# Patient Record
Sex: Male | Born: 1965 | Race: White | Hispanic: No | Marital: Married | Smoking: Former smoker
Health system: Southern US, Community
[De-identification: ages and names within clinical notes are randomized; demographics above are authoritative.]

## PROBLEM LIST (undated history)

## (undated) DIAGNOSIS — M51379 Other intervertebral disc degeneration, lumbosacral region without mention of lumbar back pain or lower extremity pain: Secondary | ICD-10-CM

## (undated) DIAGNOSIS — M533 Sacrococcygeal disorders, not elsewhere classified: Secondary | ICD-10-CM

## (undated) DIAGNOSIS — Z87442 Personal history of urinary calculi: Secondary | ICD-10-CM

## (undated) DIAGNOSIS — K219 Gastro-esophageal reflux disease without esophagitis: Secondary | ICD-10-CM

## (undated) DIAGNOSIS — M5137 Other intervertebral disc degeneration, lumbosacral region: Secondary | ICD-10-CM

## (undated) DIAGNOSIS — C801 Malignant (primary) neoplasm, unspecified: Secondary | ICD-10-CM

## (undated) DIAGNOSIS — F319 Bipolar disorder, unspecified: Secondary | ICD-10-CM

## (undated) DIAGNOSIS — F419 Anxiety disorder, unspecified: Secondary | ICD-10-CM

## (undated) DIAGNOSIS — R51 Headache: Secondary | ICD-10-CM

## (undated) DIAGNOSIS — R519 Headache, unspecified: Secondary | ICD-10-CM

## (undated) DIAGNOSIS — F329 Major depressive disorder, single episode, unspecified: Secondary | ICD-10-CM

## (undated) DIAGNOSIS — F32A Depression, unspecified: Secondary | ICD-10-CM

## (undated) DIAGNOSIS — E119 Type 2 diabetes mellitus without complications: Secondary | ICD-10-CM

## (undated) HISTORY — DX: Other intervertebral disc degeneration, lumbosacral region: M51.37

## (undated) HISTORY — DX: Other intervertebral disc degeneration, lumbosacral region without mention of lumbar back pain or lower extremity pain: M51.379

## (undated) HISTORY — DX: Sacrococcygeal disorders, not elsewhere classified: M53.3

---

## 2004-05-22 HISTORY — PX: BACK SURGERY: SHX140

## 2017-01-09 ENCOUNTER — Ambulatory Visit: Payer: Self-pay | Admitting: Physician Assistant

## 2017-01-10 ENCOUNTER — Telehealth: Payer: Self-pay | Admitting: Surgery

## 2017-01-10 NOTE — Telephone Encounter (Signed)
-----   Message from Denman George, RN sent at 01/08/2017  9:28 AM EDT ----- Regarding: Needs Consult with [facility] Contact: [phone] Please schedule a new pt. Consult with Dr. Trula Slade, prior to ALIF on 02/07/17.  Please remind the pt. To bring copy of LS spine films to appt.  Thank you.

## 2017-01-10 NOTE — Telephone Encounter (Signed)
Sched appt 01/29/17 at 1:30. Lm on hm#.

## 2017-01-17 ENCOUNTER — Encounter: Payer: Self-pay | Admitting: Surgery

## 2017-01-18 ENCOUNTER — Encounter: Payer: Self-pay | Admitting: Surgery

## 2017-01-23 ENCOUNTER — Other Ambulatory Visit: Payer: Self-pay

## 2017-01-26 ENCOUNTER — Encounter: Payer: Self-pay | Admitting: Surgery

## 2017-01-29 ENCOUNTER — Encounter: Payer: Self-pay | Admitting: Surgery

## 2017-01-29 ENCOUNTER — Ambulatory Visit (INDEPENDENT_AMBULATORY_CARE_PROVIDER_SITE_OTHER): Payer: BLUE CROSS/BLUE SHIELD | Admitting: Surgery

## 2017-01-29 VITALS — BP 129/91 | HR 84 | Temp 97.2°F | Resp 16 | Ht 69.0 in | Wt 164.0 lb

## 2017-01-29 DIAGNOSIS — M479 Spondylosis, unspecified: Secondary | ICD-10-CM | POA: Diagnosis not present

## 2017-01-29 NOTE — Progress Notes (Signed)
Vascular and Vein Specialist of [facility]  Patient name: Maxi Rodas MRN: [id number] DOB: 11-Jul-1965 Sex: male   REQUESTING PROVIDER:    Dr. Rolena Infante   REASON FOR CONSULT:    ALIF L5-S1  HISTORY OF PRESENT ILLNESS:   Erskine Steinfeldt is a 51 y.o. male, who is Referred today by Dr. Rolena Infante for evaluation of ALIF for L5-S1.  He has failed non-operative therapy, and anterior exposure has been recommended.  The patient has never had abdominal surgery.  He has a history of smoking but quit 18 years ago.  PAST MEDICAL HISTORY    Past Medical History:  Diagnosis Date  . DDD (degenerative disc disease), lumbosacral   . Sacroiliac joint pain      FAMILY HISTORY   No family history on file.  SOCIAL HISTORY:   Social History   Social History  . Marital status: Married    Spouse name: N/A  . Number of children: N/A  . Years of education: N/A   Occupational History  . Not on file.   Social History Main Topics  . Smoking status: Former Smoker    Types: Cigarettes    Quit date: 01/29/2001  . Smokeless tobacco: Never Used  . Alcohol use 1.2 oz/week    2 Cans of beer per week  . Drug use: Unknown  . Sexual activity: Not on file   Other Topics Concern  . Not on file   Social History Narrative  . No narrative on file    ALLERGIES:    No Known Allergies  CURRENT MEDICATIONS:    Current Outpatient Prescriptions  Medication Sig Dispense Refill  . atorvastatin (LIPITOR) 10 MG tablet Take 10 mg by mouth daily.    . carbamazepine (EQUETRO) 200 MG CP12 12 hr capsule Take 200 mg by mouth.    . lamoTRIgine (LAMICTAL) 200 MG tablet Take 200 mg by mouth daily.    Marland Kitchen lisdexamfetamine (VYVANSE) 40 MG capsule Take 40 mg by mouth every morning.    Marland Kitchen LORazepam (ATIVAN) 0.5 MG tablet Take 0.5 mg by mouth every 8 (eight) hours.    . metFORMIN (GLUCOPHAGE) 1000 MG tablet Take 1,000 mg by mouth 2 (two) times daily with a meal.    . SUMATRIPTAN  SUCCINATE PO Take by mouth.    . topiramate (TOPAMAX) 100 MG tablet Take 100 mg by mouth 2 (two) times daily.     No current facility-administered medications for this visit.     REVIEW OF SYSTEMS:   [X]  denotes positive finding, [ ]  denotes negative finding Cardiac  Comments:  Chest pain or chest pressure:    Shortness of breath upon exertion:    Short of breath when lying flat:    Irregular heart rhythm:        Vascular    Pain in calf, thigh, or hip brought on by ambulation:    Pain in feet at night that wakes you up from your sleep:     Blood clot in your veins:    Leg swelling:         Pulmonary    Oxygen at home:    Productive cough:     Wheezing:         Neurologic    Sudden weakness in arms or legs:     Sudden numbness in arms or legs:     Sudden onset of difficulty speaking or slurred speech:    Temporary loss of vision in one eye:     Problems  with dizziness:         Gastrointestinal    Blood in stool:      Vomited blood:         Genitourinary    Burning when urinating:     Blood in urine:        Psychiatric    Major depression:         Hematologic    Bleeding problems:    Problems with blood clotting too easily:        Skin    Rashes or ulcers:        Constitutional    Fever or chills:     PHYSICAL EXAM:   Vitals:   01/29/17 1352  BP: (!) 129/91  Pulse: 84  Resp: 16  Temp: (!) 97.2 F (36.2 C)  SpO2: 97%  Weight: 164 lb (74.4 kg)  Height: 5\' 9"  (1.753 m)    GENERAL: The patient is a well-nourished male, in no acute distress. The vital signs are documented above. CARDIAC: There is a regular rate and rhythm.  VASCULAR: Palpable pedal pulses PULMONARY: Nonlabored respirations ABDOMEN: Soft and non-tender MUSCULOSKELETAL: There are no major deformities or cyanosis. NEUROLOGIC: No focal weakness or paresthesias are detected. SKIN: There are no ulcers or rashes noted. PSYCHIATRIC: The patient has a normal affect.  STUDIES:   I have  reviewed his MRI which shows normal anatomy in the vena cava and aorta with respect to their bifurcations.  ASSESSMENT and PLAN   Degenerative disc disease: The patient has been scheduled for anterior exposure of L5-S1.  I have discussed the risks and benefits of the procedure including the risk of injury to the iliac artery and vein, ureter, and retrograde ejaculation and addition of wound complications.  All the patient's questions were answered.  He is scheduled for surgery in the immediate future.   Annamarie Major, MD Vascular and Vein Specialists of [facility] [phone] Pager [phone]

## 2017-01-31 NOTE — Pre-Procedure Instructions (Signed)
Ian Perez  01/31/2017      [facility] - [place] Buff, [place] [address] [place] [address] Phone: [phone] Fax: [place], [place] [place] [address] Phone: [phone] Fax: [phone]    Your procedure is scheduled on  Sept 19  Report to [facility] at 630 A.M.  Call this number if you have problems the morning of surgery:  [phone]   Remember:  Do not eat food or drink liquids after midnight.  Take these medicines the morning of surgery with A SIP OF WATER Lamotrigine (Lamictal), Lisdexamfetamine (Vyvanse), Lorazepam (Ativan) if needed, Sumatriptan (Imitrex) if needed  Stop taking aspirin, BC's, Goody's, Herbal medications, Fish Oil, Ibuprofen, Advil, Motrin, Aleve     How to Manage Your Diabetes Before and After Surgery  Why is it important to control my blood sugar before and after surgery? . Improving blood sugar levels before and after surgery helps healing and can limit problems. . A way of improving blood sugar control is eating a healthy diet by: o  Eating less sugar and carbohydrates o  Increasing activity/exercise o  Talking with your doctor about reaching your blood sugar goals . High blood sugars (greater than 180 mg/dL) can raise your risk of infections and slow your recovery, so you will need to focus on controlling your diabetes during the weeks before surgery. . Make sure that the doctor who takes care of your diabetes knows about your planned surgery including the date and location.  How do I manage my blood sugar before surgery? . Check your blood sugar at least 4 times a day, starting 2 days before surgery, to make sure that the level is not too high or low. o Check your blood sugar the morning of your surgery when you wake up and every 2 hours until you get to  the Short Stay unit. . If your blood sugar is less than 70 mg/dL, you will need to treat for low blood sugar: o Do not take insulin. o Treat a low blood sugar (less than 70 mg/dL) with  cup of clear juice (cranberry or apple), 4 glucose tablets, OR glucose gel. o Recheck blood sugar in 15 minutes after treatment (to make sure it is greater than 70 mg/dL). If your blood sugar is not greater than 70 mg/dL on recheck, call [phone] for further instructions. . Report your blood sugar to the short stay nurse when you get to Short Stay.  . If you are admitted to the hospital after surgery: o Your blood sugar will be checked by the staff and you will probably be given insulin after surgery (instead of oral diabetes medicines) to make sure you have good blood sugar levels. o The goal for blood sugar control after surgery is 80-180 mg/dL.              WHAT DO I DO ABOUT MY DIABETES MEDICATION?   Marland Kitchen Do not take oral diabetes medicines (pills) the morning of surgery. Metformin (Glucophage)       . The day of surgery, do not take other diabetes injectables, including Byetta (exenatide), Bydureon (exenatide ER), Victoza (liraglutide), or Trulicity (dulaglutide).  . If your CBG is greater than 220 mg/dL, you may take  of your sliding scale (correction) dose of insulin.  Other Instructions:  Patient Signature:  Date:   Nurse Signature:  Date:   Reviewed and Endorsed by [facility] Patient Education Committee, August 2015  Do not wear jewelry, make-up or nail polish.  Do not wear lotions, powders, or perfumes, or deoderant.  Do not shave 48 hours prior to surgery.  Men may shave face and neck.  Do not bring valuables to the hospital.  [facility] is not responsible for any belongings or valuables.  Contacts, dentures or bridgework may not be worn into surgery.  Leave your suitcase in the car.  After surgery it may be brought to your room.  For patients admitted to the  hospital, discharge time will be determined by your treatment team.  Patients discharged the day of surgery will not be allowed to drive home.    Special instructions: [facility] - Preparing for Surgery  Before surgery, you can play an important role.  Because skin is not sterile, your skin needs to be as free of germs as possible.  You can reduce the number of germs on you skin by washing with CHG (chlorahexidine gluconate) soap before surgery.  CHG is an antiseptic cleaner which kills germs and bonds with the skin to continue killing germs even after washing.  Please DO NOT use if you have an allergy to CHG or antibacterial soaps.  If your skin becomes reddened/irritated stop using the CHG and inform your nurse when you arrive at Short Stay.  Do not shave (including legs and underarms) for at least 48 hours prior to the first CHG shower.  You may shave your face.  Please follow these instructions carefully:   1.  Shower with CHG Soap the night before surgery and the  morning of Surgery.  2.  If you choose to wash your hair, wash your hair first as usual with your  normal shampoo.  3.  After you shampoo, rinse your hair and body thoroughly to remove the Shampoo.  4.  Use CHG as you would any other liquid soap.  You can apply chg directly to the skin and wash gently with scrungie or a clean washcloth.  5.  Apply the CHG Soap to your body ONLY FROM THE NECK DOWN.  Do not use on open wounds or open sores.  Avoid contact with your eyes,  ears, mouth and genitals (private parts).  Wash genitals (private parts)  with your normal soap.  6.  Wash thoroughly, paying special attention to the area where your surgery  will be performed.  7.  Thoroughly rinse your body with warm water from the neck down.  8.  DO NOT shower/wash with your normal soap after using and rinsing off  the CHG Soap.  9.  Pat yourself dry with a clean towel.            10.  Wear clean pajamas.            11.  Place clean sheets  on your bed the night of your first shower and do not sleep with pets.  Day of Surgery  Do not apply any lotions/deoderants the morning of surgery.  Please wear clean clothes to the hospital/surgery center.     Please read over the following fact sheets that you were given. Pain Booklet, Coughing and Deep Breathing, MRSA Information and Surgical Site Infection Prevention

## 2017-02-01 ENCOUNTER — Encounter (HOSPITAL_COMMUNITY): Payer: Self-pay

## 2017-02-01 ENCOUNTER — Encounter (HOSPITAL_COMMUNITY)
Admission: RE | Admit: 2017-02-01 | Discharge: 2017-02-01 | Disposition: A | Discharge status: Home / Self Care | Payer: BLUE CROSS/BLUE SHIELD | Source: Ambulatory Visit | Attending: Orthopedic Surgery | Admitting: Orthopedic Surgery

## 2017-02-01 DIAGNOSIS — Z01818 Encounter for other preprocedural examination: Secondary | ICD-10-CM | POA: Insufficient documentation

## 2017-02-01 DIAGNOSIS — M5137 Other intervertebral disc degeneration, lumbosacral region: Secondary | ICD-10-CM | POA: Diagnosis not present

## 2017-02-01 HISTORY — DX: Headache, unspecified: R51.9

## 2017-02-01 HISTORY — DX: Gastro-esophageal reflux disease without esophagitis: K21.9

## 2017-02-01 HISTORY — DX: Headache: R51

## 2017-02-01 HISTORY — DX: Type 2 diabetes mellitus without complications: E11.9

## 2017-02-01 HISTORY — DX: Personal history of urinary calculi: Z87.442

## 2017-02-01 HISTORY — DX: Malignant (primary) neoplasm, unspecified: C80.1

## 2017-02-01 HISTORY — DX: Major depressive disorder, single episode, unspecified: F32.9

## 2017-02-01 HISTORY — DX: Depression, unspecified: F32.A

## 2017-02-01 HISTORY — DX: Anxiety disorder, unspecified: F41.9

## 2017-02-01 HISTORY — DX: Bipolar disorder, unspecified: F31.9

## 2017-02-01 LAB — BASIC METABOLIC PANEL WITH GFR
Anion gap: 8 (ref 5–15)
BUN: 13 mg/dL (ref 6–20)
CO2: 24 mmol/L (ref 22–32)
Calcium: 9.6 mg/dL (ref 8.9–10.3)
Chloride: 104 mmol/L (ref 101–111)
Creatinine, Ser: 1.29 mg/dL — ABNORMAL HIGH (ref 0.61–1.24)
GFR calc Af Amer: >60 mL/min
GFR calc non Af Amer: >60 mL/min
Glucose, Bld: 157 mg/dL — ABNORMAL HIGH (ref 65–99)
Potassium: 4.3 mmol/L (ref 3.5–5.1)
Sodium: 136 mmol/L (ref 135–145)

## 2017-02-01 LAB — TYPE AND SCREEN
ABO/RH(D): O POS
ANTIBODY SCREEN: NEGATIVE

## 2017-02-01 LAB — CBC
HCT: 44.5 % (ref 39.0–52.0)
Hemoglobin: 14.8 g/dL (ref 13.0–17.0)
MCH: 29.2 pg (ref 26.0–34.0)
MCHC: 33.3 g/dL (ref 30.0–36.0)
MCV: 87.8 fL (ref 78.0–100.0)
PLATELETS: 227 10*3/uL (ref 150–400)
RBC: 5.07 MIL/uL (ref 4.22–5.81)
RDW: 13.5 % (ref 11.5–15.5)
WBC: 6.5 10*3/uL (ref 4.0–10.5)

## 2017-02-01 LAB — GLUCOSE, CAPILLARY: Glucose-Capillary: 149 mg/dL — ABNORMAL HIGH (ref 65–99)

## 2017-02-01 LAB — SURGICAL PCR SCREEN
MRSA, PCR: NEGATIVE
Staphylococcus aureus: NEGATIVE

## 2017-02-01 LAB — ABO/RH: ABO/RH(D): O POS

## 2017-02-01 NOTE — Progress Notes (Signed)
PCP- Dr. Vista Lawman with [facility]. Chart will be referred to anesth. For review, per Dr. Rolena Infante request. Call to [facility] & requested last Hgb A1c done on June 29th. Nichelle  at [facility] agrees to fax to [facility].

## 2017-02-02 NOTE — Progress Notes (Addendum)
Anesthesia Chart Review:  Patient is a 51 year old male scheduled for L5-S1 anterior lumbar fusion, abdominal exposure on 02/07/2017 with Melina Schools, M.D. and Harold Barban, M.D.  - PCP is Charlcie Cradle, MD (notes in care everywhere)   PMH includes: DM, bipolar disorder, GERD. Former smoker. BMI 24. Neck  Medications include: Lipitor, carbamazepine, Lamictal, Vyvanse, metformin, Topamax  Preoperative labs reviewed. - Glucose 157. HbA1c was 6.7 on 11/12/16 (care everywhere)  Stress echo 12/21/12 (care everywhere): 1. Normal stress test. Normal resting study with no wall motion abnormalities at rest and at peak stress.  2. Grade I diastolic dysfunction. 3. Trivial TR, PR, MR. 4. Mild LVH. 5. Maximum workload of 10.40 METs was achieved during exercise  If no changes, I anticipate pt can proceed with surgery as scheduled.   Willeen Cass, FNP-BC [facility]/Anesthesiology Phone: [phone] 02/02/2017 11:37 AM

## 2017-02-07 ENCOUNTER — Encounter (HOSPITAL_COMMUNITY)
Admission: RE | Disposition: A | Discharge status: Home / Self Care | Payer: Self-pay | Source: Ambulatory Visit | Attending: Orthopedic Surgery

## 2017-02-07 ENCOUNTER — Inpatient Hospital Stay (HOSPITAL_COMMUNITY): Payer: BLUE CROSS/BLUE SHIELD | Admitting: Anesthesiology

## 2017-02-07 ENCOUNTER — Inpatient Hospital Stay (HOSPITAL_COMMUNITY): Payer: BLUE CROSS/BLUE SHIELD

## 2017-02-07 ENCOUNTER — Encounter (HOSPITAL_COMMUNITY): Payer: Self-pay | Admitting: *Deleted

## 2017-02-07 ENCOUNTER — Inpatient Hospital Stay (HOSPITAL_COMMUNITY): Payer: BLUE CROSS/BLUE SHIELD | Admitting: Emergency Medicine

## 2017-02-07 ENCOUNTER — Observation Stay (HOSPITAL_COMMUNITY)
Admission: RE | Admit: 2017-02-07 | Discharge: 2017-02-08 | Disposition: A | Discharge status: Home / Self Care | Payer: BLUE CROSS/BLUE SHIELD | Source: Ambulatory Visit | Attending: Orthopedic Surgery | Admitting: Orthopedic Surgery

## 2017-02-07 DIAGNOSIS — Z419 Encounter for procedure for purposes other than remedying health state, unspecified: Secondary | ICD-10-CM

## 2017-02-07 DIAGNOSIS — G43909 Migraine, unspecified, not intractable, without status migrainosus: Secondary | ICD-10-CM | POA: Insufficient documentation

## 2017-02-07 DIAGNOSIS — M2578 Osteophyte, vertebrae: Secondary | ICD-10-CM | POA: Insufficient documentation

## 2017-02-07 DIAGNOSIS — M533 Sacrococcygeal disorders, not elsewhere classified: Secondary | ICD-10-CM | POA: Diagnosis not present

## 2017-02-07 DIAGNOSIS — M1990 Unspecified osteoarthritis, unspecified site: Secondary | ICD-10-CM | POA: Diagnosis not present

## 2017-02-07 DIAGNOSIS — Z79899 Other long term (current) drug therapy: Secondary | ICD-10-CM | POA: Insufficient documentation

## 2017-02-07 DIAGNOSIS — Z7984 Long term (current) use of oral hypoglycemic drugs: Secondary | ICD-10-CM | POA: Insufficient documentation

## 2017-02-07 DIAGNOSIS — M4320 Fusion of spine, site unspecified: Secondary | ICD-10-CM

## 2017-02-07 DIAGNOSIS — F419 Anxiety disorder, unspecified: Secondary | ICD-10-CM | POA: Diagnosis not present

## 2017-02-07 DIAGNOSIS — Z87891 Personal history of nicotine dependence: Secondary | ICD-10-CM | POA: Insufficient documentation

## 2017-02-07 DIAGNOSIS — M5137 Other intervertebral disc degeneration, lumbosacral region: Principal | ICD-10-CM | POA: Insufficient documentation

## 2017-02-07 DIAGNOSIS — E119 Type 2 diabetes mellitus without complications: Secondary | ICD-10-CM | POA: Diagnosis not present

## 2017-02-07 DIAGNOSIS — Z981 Arthrodesis status: Secondary | ICD-10-CM

## 2017-02-07 DIAGNOSIS — K219 Gastro-esophageal reflux disease without esophagitis: Secondary | ICD-10-CM | POA: Diagnosis not present

## 2017-02-07 DIAGNOSIS — E7800 Pure hypercholesterolemia, unspecified: Secondary | ICD-10-CM | POA: Diagnosis not present

## 2017-02-07 DIAGNOSIS — F319 Bipolar disorder, unspecified: Secondary | ICD-10-CM | POA: Diagnosis not present

## 2017-02-07 DIAGNOSIS — M5136 Other intervertebral disc degeneration, lumbar region: Secondary | ICD-10-CM | POA: Diagnosis present

## 2017-02-07 HISTORY — PX: ANTERIOR LUMBAR FUSION: SHX1170

## 2017-02-07 HISTORY — PX: ABDOMINAL EXPOSURE: SHX5708

## 2017-02-07 LAB — GLUCOSE, CAPILLARY
GLUCOSE-CAPILLARY: 150 mg/dL — AB (ref 65–99)
Glucose-Capillary: 125 mg/dL — ABNORMAL HIGH (ref 65–99)
Glucose-Capillary: 198 mg/dL — ABNORMAL HIGH (ref 65–99)
Glucose-Capillary: 185 mg/dL — ABNORMAL HIGH (ref 65–99)

## 2017-02-07 SURGERY — ANTERIOR LUMBAR FUSION 1 LEVEL
Anesthesia: General | Site: Spine Lumbar

## 2017-02-07 MED ORDER — FENTANYL CITRATE (PF) 250 MCG/5ML IJ SOLN
INTRAMUSCULAR | Status: AC
  Filled 2017-02-07: qty 5

## 2017-02-07 MED ORDER — SUGAMMADEX SODIUM 200 MG/2ML IV SOLN
INTRAVENOUS | Status: AC
  Filled 2017-02-07: qty 2

## 2017-02-07 MED ORDER — METFORMIN HCL 500 MG PO TABS
1000.0000 mg | ORAL_TABLET | Freq: Two times a day (BID) | ORAL | Status: DC
  Administered 2017-02-07 – 2017-02-08 (×2): 1000 mg via ORAL
  Filled 2017-02-07 (×2): qty 2

## 2017-02-07 MED ORDER — MENTHOL 3 MG MT LOZG: 1.0000 | LOZENGE | OROMUCOSAL | Status: DC | PRN

## 2017-02-07 MED ORDER — LACTATED RINGERS IV SOLN
INTRAVENOUS | Status: DC
  Administered 2017-02-07: 17:00:00 via INTRAVENOUS

## 2017-02-07 MED ORDER — ONDANSETRON HCL 4 MG/2ML IJ SOLN
INTRAMUSCULAR | Status: AC
Start: 2017-02-07 — End: ?
  Filled 2017-02-07: qty 2

## 2017-02-07 MED ORDER — FAMOTIDINE 20 MG PO TABS: 20.0000 mg | ORAL_TABLET | Freq: Two times a day (BID) | ORAL | Status: DC | PRN

## 2017-02-07 MED ORDER — MEPERIDINE HCL 25 MG/ML IJ SOLN: 6.2500 mg | INTRAMUSCULAR | Status: DC | PRN

## 2017-02-07 MED ORDER — SODIUM CHLORIDE 0.9 % IV SOLN: 250.0000 mL | INTRAVENOUS | Status: DC

## 2017-02-07 MED ORDER — LISDEXAMFETAMINE DIMESYLATE 20 MG PO CAPS
40.0000 mg | ORAL_CAPSULE | Freq: Every day | ORAL | Status: DC
  Administered 2017-02-08: 40 mg via ORAL
  Filled 2017-02-07: qty 2

## 2017-02-07 MED ORDER — PHENYLEPHRINE HCL 10 MG/ML IJ SOLN
INTRAVENOUS | Status: DC | PRN
  Administered 2017-02-07: 20 ug/min via INTRAVENOUS

## 2017-02-07 MED ORDER — CEFAZOLIN SODIUM-DEXTROSE 2-4 GM/100ML-% IV SOLN
2.0000 g | Freq: Three times a day (TID) | INTRAVENOUS | Status: AC
  Administered 2017-02-07 – 2017-02-08 (×2): 2 g via INTRAVENOUS
  Filled 2017-02-07 (×2): qty 100

## 2017-02-07 MED ORDER — LIDOCAINE 2% (20 MG/ML) 5 ML SYRINGE
INTRAMUSCULAR | Status: AC
  Filled 2017-02-07: qty 5

## 2017-02-07 MED ORDER — ACETAMINOPHEN 325 MG PO TABS: 650.0000 mg | ORAL_TABLET | ORAL | Status: DC | PRN

## 2017-02-07 MED ORDER — DOCUSATE SODIUM 100 MG PO CAPS
100.0000 mg | ORAL_CAPSULE | Freq: Two times a day (BID) | ORAL | Status: DC
  Administered 2017-02-07 – 2017-02-08 (×2): 100 mg via ORAL
  Filled 2017-02-07 (×2): qty 1

## 2017-02-07 MED ORDER — LIDOCAINE HCL (CARDIAC) 20 MG/ML IV SOLN
INTRAVENOUS | Status: DC | PRN
  Administered 2017-02-07: 80 mg via INTRAVENOUS

## 2017-02-07 MED ORDER — TOPIRAMATE 100 MG PO TABS
100.0000 mg | ORAL_TABLET | Freq: Every day | ORAL | Status: DC
  Administered 2017-02-07: 100 mg via ORAL
  Filled 2017-02-07: qty 1

## 2017-02-07 MED ORDER — ENOXAPARIN SODIUM 40 MG/0.4ML ~~LOC~~ SOLN: 40.0000 mg | SUBCUTANEOUS | 0 refills | Status: DC

## 2017-02-07 MED ORDER — MIRTAZAPINE 30 MG PO TABS
30.0000 mg | ORAL_TABLET | Freq: Every day | ORAL | Status: DC | PRN
  Filled 2017-02-07: qty 1

## 2017-02-07 MED ORDER — HYDROMORPHONE HCL 1 MG/ML IJ SOLN
0.2500 mg | INTRAMUSCULAR | Status: DC | PRN
  Administered 2017-02-07: 0.5 mg via INTRAVENOUS

## 2017-02-07 MED ORDER — BUPIVACAINE-EPINEPHRINE (PF) 0.25% -1:200000 IJ SOLN
INTRAMUSCULAR | Status: AC
  Filled 2017-02-07: qty 30

## 2017-02-07 MED ORDER — LACTATED RINGERS IV SOLN
INTRAVENOUS | Status: DC | PRN
  Administered 2017-02-07 (×2): via INTRAVENOUS

## 2017-02-07 MED ORDER — SODIUM CHLORIDE 0.9% FLUSH: 3.0000 mL | INTRAVENOUS | Status: DC | PRN

## 2017-02-07 MED ORDER — CEFAZOLIN SODIUM-DEXTROSE 2-4 GM/100ML-% IV SOLN
2.0000 g | INTRAVENOUS | Status: AC
  Administered 2017-02-07: 2 g via INTRAVENOUS
  Filled 2017-02-07: qty 100

## 2017-02-07 MED ORDER — MAGNESIUM CITRATE PO SOLN: 1.0000 | Freq: Once | ORAL | Status: DC | PRN

## 2017-02-07 MED ORDER — MIDAZOLAM HCL 5 MG/5ML IJ SOLN
INTRAMUSCULAR | Status: DC | PRN
  Administered 2017-02-07: 2 mg via INTRAVENOUS

## 2017-02-07 MED ORDER — SODIUM CHLORIDE 0.9% FLUSH: 3.0000 mL | Freq: Two times a day (BID) | INTRAVENOUS | Status: DC

## 2017-02-07 MED ORDER — PHENYLEPHRINE HCL 10 MG/ML IJ SOLN
INTRAMUSCULAR | Status: DC | PRN
  Administered 2017-02-07: 80 ug via INTRAVENOUS
  Administered 2017-02-07: 40 ug via INTRAVENOUS
  Administered 2017-02-07 (×3): 80 ug via INTRAVENOUS

## 2017-02-07 MED ORDER — LAMOTRIGINE 100 MG PO TABS
200.0000 mg | ORAL_TABLET | Freq: Two times a day (BID) | ORAL | Status: DC
  Administered 2017-02-07 – 2017-02-08 (×2): 200 mg via ORAL
  Filled 2017-02-07 (×2): qty 2

## 2017-02-07 MED ORDER — METHOCARBAMOL 500 MG PO TABS: 500.0000 mg | ORAL_TABLET | Freq: Three times a day (TID) | ORAL | 0 refills | Status: AC

## 2017-02-07 MED ORDER — OXYCODONE HCL 5 MG/5ML PO SOLN: 5.0000 mg | Freq: Once | ORAL | Status: DC | PRN

## 2017-02-07 MED ORDER — POLYETHYLENE GLYCOL 3350 17 G PO PACK
17.0000 g | PACK | Freq: Every day | ORAL | Status: DC | PRN
  Filled 2017-02-07: qty 1

## 2017-02-07 MED ORDER — ENOXAPARIN SODIUM 40 MG/0.4ML ~~LOC~~ SOLN: 40.0000 mg | Freq: Two times a day (BID) | SUBCUTANEOUS | 0 refills | Status: DC

## 2017-02-07 MED ORDER — CHLORHEXIDINE GLUCONATE 4 % EX LIQD: 60.0000 mL | Freq: Once | CUTANEOUS | Status: DC

## 2017-02-07 MED ORDER — ACETAMINOPHEN 10 MG/ML IV SOLN
1000.0000 mg | INTRAVENOUS | Status: AC
Start: 2017-02-07 — End: 2017-02-07
  Administered 2017-02-07: 1000 mg via INTRAVENOUS
  Filled 2017-02-07: qty 100

## 2017-02-07 MED ORDER — MIDAZOLAM HCL 2 MG/2ML IJ SOLN
INTRAMUSCULAR | Status: AC
  Filled 2017-02-07: qty 2

## 2017-02-07 MED ORDER — PROMETHAZINE HCL 25 MG/ML IJ SOLN: 6.2500 mg | INTRAMUSCULAR | Status: DC | PRN

## 2017-02-07 MED ORDER — ENOXAPARIN SODIUM 40 MG/0.4ML ~~LOC~~ SOLN: 40.0000 mg | SUBCUTANEOUS | 0 refills | Status: AC

## 2017-02-07 MED ORDER — DEXTROSE 5 % IV SOLN
500.0000 mg | Freq: Four times a day (QID) | INTRAVENOUS | Status: DC | PRN
  Filled 2017-02-07: qty 5

## 2017-02-07 MED ORDER — OXYCODONE HCL 5 MG PO TABS
5.0000 mg | ORAL_TABLET | ORAL | Status: DC | PRN
  Administered 2017-02-07 – 2017-02-08 (×6): 10 mg via ORAL
  Filled 2017-02-07 (×6): qty 2

## 2017-02-07 MED ORDER — SUGAMMADEX SODIUM 200 MG/2ML IV SOLN
INTRAVENOUS | Status: DC | PRN
  Administered 2017-02-07: 150 mg via INTRAVENOUS

## 2017-02-07 MED ORDER — LACTATED RINGERS IV SOLN
INTRAVENOUS | Status: DC | PRN
  Administered 2017-02-07 (×2): via INTRAVENOUS

## 2017-02-07 MED ORDER — DEXAMETHASONE SODIUM PHOSPHATE 10 MG/ML IJ SOLN
INTRAMUSCULAR | Status: AC
Start: 2017-02-07 — End: ?
  Filled 2017-02-07: qty 1

## 2017-02-07 MED ORDER — PHENYLEPHRINE HCL 10 MG/ML IJ SOLN
0.0000 ug/min | INTRAMUSCULAR | Status: DC
  Filled 2017-02-07 (×2): qty 1

## 2017-02-07 MED ORDER — PHENOL 1.4 % MT LIQD: 1.0000 | OROMUCOSAL | Status: DC | PRN

## 2017-02-07 MED ORDER — ENOXAPARIN SODIUM 40 MG/0.4ML ~~LOC~~ SOLN: 40.0000 mg | SUBCUTANEOUS | Status: DC

## 2017-02-07 MED ORDER — SUMATRIPTAN SUCCINATE 50 MG PO TABS: 100.0000 mg | ORAL_TABLET | ORAL | Status: DC | PRN

## 2017-02-07 MED ORDER — ROCURONIUM BROMIDE 100 MG/10ML IV SOLN
INTRAVENOUS | Status: DC | PRN
  Administered 2017-02-07: 30 mg via INTRAVENOUS
  Administered 2017-02-07 (×2): 20 mg via INTRAVENOUS
  Administered 2017-02-07: 50 mg via INTRAVENOUS

## 2017-02-07 MED ORDER — CARBAMAZEPINE ER 200 MG PO TB12
600.0000 mg | ORAL_TABLET | Freq: Every evening | ORAL | Status: DC
  Administered 2017-02-07: 600 mg via ORAL
  Filled 2017-02-07 (×2): qty 3

## 2017-02-07 MED ORDER — PROPOFOL 10 MG/ML IV BOLUS
INTRAVENOUS | Status: AC
  Filled 2017-02-07: qty 20

## 2017-02-07 MED ORDER — 0.9 % SODIUM CHLORIDE (POUR BTL) OPTIME
TOPICAL | Status: DC | PRN
  Administered 2017-02-07 (×2): 1000 mL

## 2017-02-07 MED ORDER — ROCURONIUM BROMIDE 10 MG/ML (PF) SYRINGE
PREFILLED_SYRINGE | INTRAVENOUS | Status: AC
  Filled 2017-02-07: qty 5

## 2017-02-07 MED ORDER — HEMOSTATIC AGENTS (NO CHARGE) OPTIME
TOPICAL | Status: DC | PRN
  Administered 2017-02-07: 1 via TOPICAL

## 2017-02-07 MED ORDER — METHOCARBAMOL 500 MG PO TABS
500.0000 mg | ORAL_TABLET | Freq: Four times a day (QID) | ORAL | Status: DC | PRN
  Administered 2017-02-07 – 2017-02-08 (×3): 500 mg via ORAL
  Filled 2017-02-07 (×3): qty 1

## 2017-02-07 MED ORDER — OXYCODONE-ACETAMINOPHEN 10-325 MG PO TABS: 1.0000 | ORAL_TABLET | ORAL | 0 refills | Status: AC | PRN

## 2017-02-07 MED ORDER — PROPOFOL 10 MG/ML IV BOLUS
INTRAVENOUS | Status: DC | PRN
  Administered 2017-02-07: 150 mg via INTRAVENOUS

## 2017-02-07 MED ORDER — OXYCODONE HCL 5 MG PO TABS
ORAL_TABLET | ORAL | Status: AC
  Administered 2017-02-07: 10 mg via ORAL
  Filled 2017-02-07: qty 2

## 2017-02-07 MED ORDER — ONDANSETRON HCL 4 MG PO TABS: 4.0000 mg | ORAL_TABLET | Freq: Four times a day (QID) | ORAL | Status: DC | PRN

## 2017-02-07 MED ORDER — PHENYLEPHRINE 40 MCG/ML (10ML) SYRINGE FOR IV PUSH (FOR BLOOD PRESSURE SUPPORT)
PREFILLED_SYRINGE | INTRAVENOUS | Status: AC
  Filled 2017-02-07: qty 10

## 2017-02-07 MED ORDER — ONDANSETRON HCL 4 MG/2ML IJ SOLN
INTRAMUSCULAR | Status: DC | PRN
  Administered 2017-02-07: 4 mg via INTRAVENOUS

## 2017-02-07 MED ORDER — LORAZEPAM 0.5 MG PO TABS
0.5000 mg | ORAL_TABLET | Freq: Two times a day (BID) | ORAL | Status: DC
  Administered 2017-02-07 – 2017-02-08 (×2): 0.5 mg via ORAL
  Filled 2017-02-07 (×2): qty 1

## 2017-02-07 MED ORDER — OXYCODONE HCL 5 MG PO TABS: 5.0000 mg | ORAL_TABLET | Freq: Once | ORAL | Status: DC | PRN

## 2017-02-07 MED ORDER — ONDANSETRON 4 MG PO TBDP: 4.0000 mg | ORAL_TABLET | Freq: Three times a day (TID) | ORAL | 0 refills | Status: AC | PRN

## 2017-02-07 MED ORDER — DEXAMETHASONE SODIUM PHOSPHATE 10 MG/ML IJ SOLN
INTRAMUSCULAR | Status: DC | PRN
  Administered 2017-02-07: 4 mg via INTRAVENOUS

## 2017-02-07 MED ORDER — THROMBIN 20000 UNITS EX SOLR
CUTANEOUS | Status: AC
  Filled 2017-02-07: qty 20000

## 2017-02-07 MED ORDER — ATORVASTATIN CALCIUM 20 MG PO TABS
10.0000 mg | ORAL_TABLET | Freq: Every day | ORAL | Status: DC
  Administered 2017-02-07: 10 mg via ORAL
  Filled 2017-02-07: qty 1

## 2017-02-07 MED ORDER — ENOXAPARIN SODIUM 40 MG/0.4ML ~~LOC~~ SOLN: 40.0000 mg | Freq: Two times a day (BID) | SUBCUTANEOUS | Status: DC

## 2017-02-07 MED ORDER — MORPHINE SULFATE (PF) 4 MG/ML IV SOLN: 2.0000 mg | INTRAVENOUS | Status: DC | PRN

## 2017-02-07 MED ORDER — ONDANSETRON HCL 4 MG/2ML IJ SOLN: 4.0000 mg | Freq: Four times a day (QID) | INTRAMUSCULAR | Status: DC | PRN

## 2017-02-07 MED ORDER — HYDROMORPHONE HCL 1 MG/ML IJ SOLN
INTRAMUSCULAR | Status: AC
  Administered 2017-02-07: 0.5 mg via INTRAVENOUS
  Filled 2017-02-07: qty 1

## 2017-02-07 MED ORDER — ACETAMINOPHEN 650 MG RE SUPP: 650.0000 mg | RECTAL | Status: DC | PRN

## 2017-02-07 MED ORDER — FENTANYL CITRATE (PF) 100 MCG/2ML IJ SOLN
INTRAMUSCULAR | Status: DC | PRN
  Administered 2017-02-07: 100 ug via INTRAVENOUS
  Administered 2017-02-07 (×3): 50 ug via INTRAVENOUS

## 2017-02-07 SURGICAL SUPPLY — 104 items
APPLIER CLIP 11 MED OPEN (CLIP)
BLADE CLIPPER SURG (BLADE) ×4 IMPLANT
BLADE SURG 10 STRL SS (BLADE) ×4 IMPLANT
BONE VIVIGEN FORMABLE 5.4CC (Bone Implant) ×4 IMPLANT
CLIP APPLIE 11 MED OPEN (CLIP) IMPLANT
CLIP VESOCCLUDE MED 24/CT (CLIP) IMPLANT
CLIP VESOCCLUDE SM WIDE 24/CT (CLIP) IMPLANT
CLOSURE STERI-STRIP 1/2X4 (GAUZE/BANDAGES/DRESSINGS) ×1
CLOSURE WOUND 1/2 X4 (GAUZE/BANDAGES/DRESSINGS)
CLSR STERI-STRIP ANTIMIC 1/2X4 (GAUZE/BANDAGES/DRESSINGS) ×3 IMPLANT
CORDS BIPOLAR (ELECTRODE) ×4 IMPLANT
COVER BACK TABLE 60X90IN (DRAPES) ×4 IMPLANT
COVER SURGICAL LIGHT HANDLE (MISCELLANEOUS) ×4 IMPLANT
DERMABOND ADVANCED (GAUZE/BANDAGES/DRESSINGS)
DERMABOND ADVANCED .7 DNX12 (GAUZE/BANDAGES/DRESSINGS) IMPLANT
DRAPE C-ARM 42X72 X-RAY (DRAPES) ×8 IMPLANT
DRAPE HALF SHEET 40X57 (DRAPES) ×4 IMPLANT
DRAPE INCISE IOBAN 66X45 STRL (DRAPES) ×4 IMPLANT
DRAPE POUCH INSTRU U-SHP 10X18 (DRAPES) ×4 IMPLANT
DRAPE SURG 17X23 STRL (DRAPES) ×4 IMPLANT
DRAPE U-SHAPE 47X51 STRL (DRAPES) ×4 IMPLANT
DRSG MEPILEX BORDER 4X8 (GAUZE/BANDAGES/DRESSINGS) IMPLANT
DRSG OPSITE POSTOP 4X8 (GAUZE/BANDAGES/DRESSINGS) ×4 IMPLANT
DURAPREP 26ML APPLICATOR (WOUND CARE) ×4 IMPLANT
ELECT BLADE 4.0 EZ CLEAN MEGAD (MISCELLANEOUS) ×4
ELECT CAUTERY BLADE 6.4 (BLADE) ×4 IMPLANT
ELECT PENCIL ROCKER SW 15FT (MISCELLANEOUS) ×4 IMPLANT
ELECT REM PT RETURN 9FT ADLT (ELECTROSURGICAL) ×4
ELECTRODE BLDE 4.0 EZ CLN MEGD (MISCELLANEOUS) ×2 IMPLANT
ELECTRODE REM PT RTRN 9FT ADLT (ELECTROSURGICAL) ×2 IMPLANT
GAUZE SPONGE 4X4 16PLY XRAY LF (GAUZE/BANDAGES/DRESSINGS) IMPLANT
GLOVE BIO SURGEON STRL SZ 6.5 (GLOVE) ×3 IMPLANT
GLOVE BIO SURGEON STRL SZ7.5 (GLOVE) IMPLANT
GLOVE BIO SURGEONS STRL SZ 6.5 (GLOVE) ×1
GLOVE BIOGEL PI IND STRL 6.5 (GLOVE) ×2 IMPLANT
GLOVE BIOGEL PI IND STRL 7.5 (GLOVE) ×2 IMPLANT
GLOVE BIOGEL PI IND STRL 8.5 (GLOVE) ×4 IMPLANT
GLOVE BIOGEL PI INDICATOR 6.5 (GLOVE) ×2
GLOVE BIOGEL PI INDICATOR 7.5 (GLOVE) ×2
GLOVE BIOGEL PI INDICATOR 8.5 (GLOVE) ×4
GLOVE SS BIOGEL STRL SZ 8.5 (GLOVE) ×4 IMPLANT
GLOVE SUPERSENSE BIOGEL SZ 8.5 (GLOVE) ×4
GLOVE SURG SS PI 7.5 STRL IVOR (GLOVE) ×4 IMPLANT
GOWN STRL REUS W/ TWL LRG LVL3 (GOWN DISPOSABLE) ×6 IMPLANT
GOWN STRL REUS W/ TWL XL LVL3 (GOWN DISPOSABLE) ×2 IMPLANT
GOWN STRL REUS W/TWL 2XL LVL3 (GOWN DISPOSABLE) ×12 IMPLANT
GOWN STRL REUS W/TWL LRG LVL3 (GOWN DISPOSABLE) ×6
GOWN STRL REUS W/TWL XL LVL3 (GOWN DISPOSABLE) ×2
HEMOSTAT SNOW SURGICEL 2X4 (HEMOSTASIS) IMPLANT
INSERT FOGARTY 61MM (MISCELLANEOUS) IMPLANT
INSERT FOGARTY SM (MISCELLANEOUS) IMPLANT
INTERPLATE 39X14X8 (Plate) ×4 IMPLANT
KIT BASIN OR (CUSTOM PROCEDURE TRAY) ×4 IMPLANT
KIT ROOM TURNOVER OR (KITS) ×8 IMPLANT
LOOP VESSEL MAXI BLUE (MISCELLANEOUS) ×4 IMPLANT
LOOP VESSEL MINI RED (MISCELLANEOUS) ×4 IMPLANT
NEEDLE SPNL 18GX3.5 QUINCKE PK (NEEDLE) ×4 IMPLANT
NS IRRIG 1000ML POUR BTL (IV SOLUTION) ×12 IMPLANT
PACK LAMINECTOMY ORTHO (CUSTOM PROCEDURE TRAY) ×4 IMPLANT
PACK UNIVERSAL I (CUSTOM PROCEDURE TRAY) ×4 IMPLANT
PAD ARMBOARD 7.5X6 YLW CONV (MISCELLANEOUS) ×8 IMPLANT
PATTIES SURGICAL .5 X1 (DISPOSABLE) ×4 IMPLANT
PEEK SPACER INTERPLAT 35X14X8 (Peek) ×4 IMPLANT
PLATE SPINAL INTERPLAT 39X14X8 (Plate) ×2 IMPLANT
SCREW BONE RESCUE (Screw) ×12 IMPLANT
SCREW SPINAL STD (Orthopedic Implant) ×4 IMPLANT
SPONGE INTESTINAL PEANUT (DISPOSABLE) ×12 IMPLANT
SPONGE LAP 18X18 X RAY DECT (DISPOSABLE) IMPLANT
SPONGE LAP 4X18 X RAY DECT (DISPOSABLE) IMPLANT
SPONGE SURGIFOAM ABS GEL 100 (HEMOSTASIS) IMPLANT
STRIP CLOSURE SKIN 1/2X4 (GAUZE/BANDAGES/DRESSINGS) IMPLANT
SURGIFLO W/THROMBIN 8M KIT (HEMOSTASIS) ×4 IMPLANT
SUT BONE WAX W31G (SUTURE) ×4 IMPLANT
SUT MNCRL AB 4-0 PS2 18 (SUTURE) IMPLANT
SUT MON AB 3-0 SH 27 (SUTURE) ×2
SUT MON AB 3-0 SH27 (SUTURE) ×2 IMPLANT
SUT PDS AB 1 CTX 36 (SUTURE) ×8 IMPLANT
SUT PROLENE 4 0 RB 1 (SUTURE) ×2
SUT PROLENE 4-0 RB1 .5 CRCL 36 (SUTURE) ×2 IMPLANT
SUT PROLENE 5 0 C 1 24 (SUTURE) IMPLANT
SUT PROLENE 5 0 CC1 (SUTURE) IMPLANT
SUT PROLENE 6 0 C 1 30 (SUTURE) IMPLANT
SUT PROLENE 6 0 CC (SUTURE) IMPLANT
SUT SILK 0 TIES 10X30 (SUTURE) IMPLANT
SUT SILK 2 0 TIES 10X30 (SUTURE) ×4 IMPLANT
SUT SILK 2 0SH CR/8 30 (SUTURE) IMPLANT
SUT SILK 3 0 TIES 10X30 (SUTURE) ×4 IMPLANT
SUT SILK 3 0SH CR/8 30 (SUTURE) IMPLANT
SUT VIC AB 0 CT1 27 (SUTURE)
SUT VIC AB 0 CT1 27XBRD ANBCTR (SUTURE) IMPLANT
SUT VIC AB 1 CT1 27 (SUTURE) ×4
SUT VIC AB 1 CT1 27XBRD ANBCTR (SUTURE) ×4 IMPLANT
SUT VIC AB 2-0 CT1 18 (SUTURE) ×8 IMPLANT
SUT VIC AB 2-0 CT1 27 (SUTURE)
SUT VIC AB 2-0 CT1 TAPERPNT 27 (SUTURE) IMPLANT
SUT VIC AB 3-0 SH 27 (SUTURE)
SUT VIC AB 3-0 SH 27X BRD (SUTURE) IMPLANT
SYR BULB IRRIGATION 50ML (SYRINGE) ×4 IMPLANT
SYR CONTROL 10ML LL (SYRINGE) ×4 IMPLANT
TOWEL OR 17X24 6PK STRL BLUE (TOWEL DISPOSABLE) IMPLANT
TOWEL OR 17X26 10 PK STRL BLUE (TOWEL DISPOSABLE) IMPLANT
TRAP SPECIMEN MUCOUS 40CC (MISCELLANEOUS) IMPLANT
TRAY FOLEY CATH SILVER 16FR (SET/KITS/TRAYS/PACK) ×4 IMPLANT
WATER STERILE IRR 1000ML POUR (IV SOLUTION) IMPLANT

## 2017-02-07 NOTE — Discharge Instructions (Signed)
Spinal Fusion, Care After °These instructions give you information about caring for yourself after your procedure. Your doctor may also give you more specific instructions. Call your doctor if you have any problems or questions after your procedure. °Follow these instructions at home: °Medicines °· Take over-the-counter and prescription medicines only as told by your doctor. These include any medicines for pain. °· Do not drive for 24 hours if you received a sedative. °· Do not drive or use heavy machinery while taking prescription pain medicine. °· If you were prescribed an antibiotic medicine, take it as told by your doctor. Do not stop taking the antibiotic even if you start to feel better. °Surgical Cut (Incision) Care °· Follow instructions from your doctor about how to take care of your surgical cut. Make sure you: °? Wash your hands with soap and water before you change your bandage (dressing). If you cannot use soap and water, use hand sanitizer. °? Change your bandage as told by your doctor. °? Leave stitches (sutures), skin glue, or skin tape (adhesive) strips in place. They may need to stay in place for 2 weeks or longer. If tape strips get loose and curl up, you may trim the loose edges. Do not remove tape strips completely unless your doctor says it is okay. °· Keep your surgical cut clean and dry. Do not take baths, swim, or use a hot tub until your doctor says it is okay. °· Check your surgical cut and the area around it every day for: °? Redness. °? Swelling. °? Fluid. °Physical Activity °· Return to your normal activities as told by your doctor. Ask your doctor what activities are safe for you. Rest and protect your back as much as you can. °· Follow instructions from your doctor about how to move. Use good posture to help your spine heal. °· Do not lift anything that is heavier than 8 lb (3.6 kg) or as told by your doctor until he or she says that it is safe. Do not lift anything over your  head. °· Do not twist or bend at the waist until your doctor says it is okay. °· Avoid pushing or pulling motions. °· Do not sit or lie down in the same position for long periods of time. °· Do not start to exercise until your doctor says it is okay. Ask your doctor what kinds of exercise you can do to make your back stronger. °General instructions °· If you were given a brace, use it as told by your doctor. °· Wear compression stockings as told by your doctor. °· Do not use tobacco products. These include cigarettes, chewing tobacco, or e-cigarettes. If you need help quitting, ask your doctor. °· Keep all follow-up visits as told by your doctor. This is important. This includes any visits with your physical therapist, if this applies. °Contact a doctor if: °· Your pain gets worse. °· Your medicine does not help your pain. °· Your legs or feet become painful or swollen. °· Your surgical cut is red, swollen, or painful. °· You have fluid, blood, or pus coming from your surgical cut. °· You feel sick to your stomach (nauseous). °· You throw up (vomit). °· Your have weakness or loss of feeling (numbness) in your legs that is new or getting worse. °· You have a fever. °· You have trouble controlling when you pee (urinate) or poop (have a bowel movement). °Get help right away if: °· Your pain is very bad. °· You have   chest pain. °· You have trouble breathing. °· You start to have a cough. °These symptoms may be an emergency. Do not wait to see if the symptoms will go away. Get medical help right away. Call your local emergency services (911 in the [place] Do not drive yourself to the hospital. °This information is not intended to replace advice given to you by your health care provider. Make sure you discuss any questions you have with your health care provider. °Document Released: 09/01/2010 Document Revised: 01/04/2016 Document Reviewed: 10/21/2014 °Elsevier Interactive Patient Education © 2018 [employer] ° °

## 2017-02-07 NOTE — Anesthesia Preprocedure Evaluation (Addendum)
Anesthesia Evaluation  Patient identified by MRN, date of birth, ID band Patient awake    Reviewed: Allergy & Precautions, NPO status , Patient's Chart, lab work & pertinent test results  Airway Mallampati: II  TM Distance: <3 FB Neck ROM: Full    Dental no notable dental hx. (+) Teeth Intact, Dental Advisory Given   Pulmonary neg pulmonary ROS, former smoker,    Pulmonary exam normal breath sounds clear to auscultation       Cardiovascular negative cardio ROS Normal cardiovascular exam Rhythm:Regular Rate:Normal     Neuro/Psych  Headaches, PSYCHIATRIC DISORDERS Anxiety Depression Bipolar Disorder negative neurological ROS  negative psych ROS   GI/Hepatic negative GI ROS, Neg liver ROS, GERD  Medicated and Controlled,  Endo/Other  negative endocrine ROSdiabetes, Type 2, Oral Hypoglycemic Agents  Renal/GU negative Renal ROS  negative genitourinary   Musculoskeletal negative musculoskeletal ROS (+) Arthritis ,   Abdominal   Peds negative pediatric ROS (+)  Hematology negative hematology ROS (+)   Anesthesia Other Findings   Reproductive/Obstetrics negative OB ROS                            Anesthesia Physical Anesthesia Plan  ASA: III  Anesthesia Plan: General   Post-op Pain Management:    Induction: Intravenous  PONV Risk Score and Plan: 2 and Ondansetron, Treatment may vary due to age or medical condition and Midazolam  Airway Management Planned: Oral ETT  Additional Equipment:   Intra-op Plan:   Post-operative Plan: Extubation in OR  Informed Consent: I have reviewed the patients History and Physical, chart, labs and discussed the procedure including the risks, benefits and alternatives for the proposed anesthesia with the patient or authorized representative who has indicated his/her understanding and acceptance.   Dental advisory given  Plan Discussed with:  CRNA  Anesthesia Plan Comments:         Anesthesia Quick Evaluation

## 2017-02-07 NOTE — Interval H&P Note (Signed)
History and Physical Interval Note:  02/07/2017 7:53 AM  Ian Perez  has presented today for surgery, with the diagnosis of Degenerative disc disease L5-S1  The various methods of treatment have been discussed with the patient and family. After consideration of risks, benefits and other options for treatment, the patient has consented to  Procedure(s) with comments: ANTERIOR LUMBAR FUSION L5-S1 (N/A) - 3 hrs ABDOMINAL EXPOSURE (N/A) as a surgical intervention .  The patient's history has been reviewed, patient examined, no change in status, stable for surgery.  I have reviewed the patient's chart and labs.  Questions were answered to the patient's satisfaction.     Annamarie Major

## 2017-02-07 NOTE — H&P (View-Only) (Signed)
Vascular and Vein Specialist of [facility]  Patient name: Ian Perez MRN: [id number] DOB: [birth year] Sex: male   REQUESTING PROVIDER:    Dr. Rolena Infante   REASON FOR CONSULT:    ALIF L5-S1  HISTORY OF PRESENT ILLNESS:   Ian Perez is a 51 y.o. male, who is Referred today by Dr. Rolena Infante for evaluation of ALIF for L5-S1.  He has failed non-operative therapy, and anterior exposure has been recommended.  The patient has never had abdominal surgery.  He has a history of smoking but quit 18 years ago.  PAST MEDICAL HISTORY    Past Medical History:  Diagnosis Date  . DDD (degenerative disc disease), lumbosacral   . Sacroiliac joint pain      FAMILY HISTORY   No family history on file.  SOCIAL HISTORY:   Social History   Social History  . Marital status: Married    Spouse name: N/A  . Number of children: N/A  . Years of education: N/A   Occupational History  . Not on file.   Social History Main Topics  . Smoking status: Former Smoker    Types: Cigarettes    Quit date: 01/29/2001  . Smokeless tobacco: Never Used  . Alcohol use 1.2 oz/week    2 Cans of beer per week  . Drug use: Unknown  . Sexual activity: Not on file   Other Topics Concern  . Not on file   Social History Narrative  . No narrative on file    ALLERGIES:    No Known Allergies  CURRENT MEDICATIONS:    Current Outpatient Prescriptions  Medication Sig Dispense Refill  . atorvastatin (LIPITOR) 10 MG tablet Take 10 mg by mouth daily.    . carbamazepine (EQUETRO) 200 MG CP12 12 hr capsule Take 200 mg by mouth.    . lamoTRIgine (LAMICTAL) 200 MG tablet Take 200 mg by mouth daily.    Marland Kitchen lisdexamfetamine (VYVANSE) 40 MG capsule Take 40 mg by mouth every morning.    Marland Kitchen LORazepam (ATIVAN) 0.5 MG tablet Take 0.5 mg by mouth every 8 (eight) hours.    . metFORMIN (GLUCOPHAGE) 1000 MG tablet Take 1,000 mg by mouth 2 (two) times daily with a meal.    . SUMATRIPTAN  SUCCINATE PO Take by mouth.    . topiramate (TOPAMAX) 100 MG tablet Take 100 mg by mouth 2 (two) times daily.     No current facility-administered medications for this visit.     REVIEW OF SYSTEMS:   [X]  denotes positive finding, [ ]  denotes negative finding Cardiac  Comments:  Chest pain or chest pressure:    Shortness of breath upon exertion:    Short of breath when lying flat:    Irregular heart rhythm:        Vascular    Pain in calf, thigh, or hip brought on by ambulation:    Pain in feet at night that wakes you up from your sleep:     Blood clot in your veins:    Leg swelling:         Pulmonary    Oxygen at home:    Productive cough:     Wheezing:         Neurologic    Sudden weakness in arms or legs:     Sudden numbness in arms or legs:     Sudden onset of difficulty speaking or slurred speech:    Temporary loss of vision in one eye:     Problems  with dizziness:         Gastrointestinal    Blood in stool:      Vomited blood:         Genitourinary    Burning when urinating:     Blood in urine:        Psychiatric    Major depression:         Hematologic    Bleeding problems:    Problems with blood clotting too easily:        Skin    Rashes or ulcers:        Constitutional    Fever or chills:     PHYSICAL EXAM:   Vitals:   01/29/17 1352  BP: (!) 129/91  Pulse: 84  Resp: 16  Temp: (!) 97.2 F (36.2 C)  SpO2: 97%  Weight: 164 lb (74.4 kg)  Height: 5\' 9"  (1.753 m)    GENERAL: The patient is a well-nourished male, in no acute distress. The vital signs are documented above. CARDIAC: There is a regular rate and rhythm.  VASCULAR: Palpable pedal pulses PULMONARY: Nonlabored respirations ABDOMEN: Soft and non-tender MUSCULOSKELETAL: There are no major deformities or cyanosis. NEUROLOGIC: No focal weakness or paresthesias are detected. SKIN: There are no ulcers or rashes noted. PSYCHIATRIC: The patient has a normal affect.  STUDIES:   I have  reviewed his MRI which shows normal anatomy in the vena cava and aorta with respect to their bifurcations.  ASSESSMENT and PLAN   Degenerative disc disease: The patient has been scheduled for anterior exposure of L5-S1.  I have discussed the risks and benefits of the procedure including the risk of injury to the iliac artery and vein, ureter, and retrograde ejaculation and addition of wound complications.  All the patient's questions were answered.  He is scheduled for surgery in the immediate future.   Annamarie Major, MD [facility] and Vein Specialists of [facility] [phone] Pager [phone]

## 2017-02-07 NOTE — Anesthesia Procedure Notes (Signed)
Procedure Name: Intubation Date/Time: 02/07/2017 8:53 AM Performed by: Jenne Campus Pre-anesthesia Checklist: Patient identified, Emergency Drugs available, Suction available and Patient being monitored Patient Re-evaluated:Patient Re-evaluated prior to induction Oxygen Delivery Method: Circle System Utilized Preoxygenation: Pre-oxygenation with 100% oxygen Induction Type: IV induction Ventilation: Mask ventilation without difficulty Laryngoscope Size: Miller and 2 Grade View: Grade I Tube type: Oral Tube size: 7.5 mm Number of attempts: 1 Airway Equipment and Method: Stylet and Oral airway Placement Confirmation: ETT inserted through vocal cords under direct vision,  positive ETCO2 and breath sounds checked- equal and bilateral Secured at: 22 cm Tube secured with: Tape Dental Injury: Teeth and Oropharynx as per pre-operative assessment

## 2017-02-07 NOTE — Anesthesia Postprocedure Evaluation (Signed)
Anesthesia Post Note  Patient: Ian Perez  Procedure(s) Performed: Procedure(s) (LRB): ANTERIOR LUMBAR FUSION L5-S1 (N/A) ABDOMINAL EXPOSURE (N/A)     Patient location during evaluation: PACU Anesthesia Type: General Level of consciousness: awake and alert Pain management: pain level controlled Vital Signs Assessment: post-procedure vital signs reviewed and stable Respiratory status: spontaneous breathing, nonlabored ventilation and respiratory function stable Cardiovascular status: blood pressure returned to baseline and stable Postop Assessment: no apparent nausea or vomiting Anesthetic complications: no    Last Vitals:  Vitals:   02/07/17 1322 02/07/17 1326  BP: 111/74   Pulse:  90  Resp:  (!) 8  Temp:    SpO2:  96%    Last Pain:  Vitals:   02/07/17 1315  TempSrc:   PainSc: Irena

## 2017-02-07 NOTE — Transfer of Care (Signed)
Immediate Anesthesia Transfer of Care Note  Patient: Ian Perez  Procedure(s) Performed: Procedure(s) with comments: ANTERIOR LUMBAR FUSION L5-S1 (N/A) - 3 hrs ABDOMINAL EXPOSURE (N/A)  Patient Location: PACU  Anesthesia Type:General  Level of Consciousness: awake, oriented and patient cooperative  Airway & Oxygen Therapy: Patient Spontanous Breathing and Patient connected to face mask oxygen  Post-op Assessment: Report given to RN and Post -op Vital signs reviewed and stable  Post vital signs: Reviewed  Last Vitals:  Vitals:   02/07/17 0710 02/07/17 1208  BP: (!) 136/92   Pulse: 85 94  Resp: 18 (!) 9  Temp: 36.6 C 36.9 C  SpO2: 98% 99%    Last Pain:  Vitals:   02/07/17 0710  TempSrc: Oral  PainSc:          Complications: No apparent anesthesia complications

## 2017-02-07 NOTE — H&P (Signed)
History of Present Illness  The patient is a 51 year old male who comes in today for a preoperative History and Physical. The patient is scheduled for a ALIF L5-S1 to be performed by Dr. Duane Lope D. Rolena Infante, MD at [facility] on 02-07-17 . The pt has DM2 and reports it is well controlled and take Metformin.   Problem List/Past Medical  Chronic right-sided low back pain without sciatica (M54.5)  Pre-MRI Lab Exam (T01.601)  Lumbosacral DDD (M51.37)  Sacroiliac joint pain (M53.3)  Problems Reconciled   Allergies  No Known Drug Allergies  Allergies Reconciled   Family History  Depression  Brother. Diabetes Mellitus  Father. Hypertension  Father. Rheumatoid Arthritis  Mother.  Social History  Tobacco / smoke exposure  Tobacco use  Former smoker. 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 Vyvanse (40MG  Capsule, Oral) Active. LamoTRIgine (200MG  Tablet, Oral) Active. Lithium Carbonate (300MG  Capsule, Oral) Active. LORazepam (0.5MG  Tablet, Oral) Active. Topiramate (100MG  Tablet, Oral) Active. MetFORMIN HCl (1000MG  Tablet, Oral) Active. Atorvastatin Calcium (10MG  Tablet, Oral) Active. SUMAtriptan Succinate (100MG  Tablet, Oral) Active. Equetro (200MG  Capsule ER 12HR, Oral) Active. Medications Reconciled  Past Surgical History  Spinal Surgery   Other Problems  Depression  Diabetes Mellitus, Type II  Hypercholesterolemia  Migraine Headache   Vitals  01/29/2017 7:48 AM Weight: 157 lb Height:  68.5in Body Surface Area: 1.85 m Body Mass Index: 23.52 kg/m  Temp.: 97.40F(Oral)  Pulse: 75 (Regular)  BP: 128/96 (Sitting, Left Arm, Standard)  General General Appearance-Not in acute distress. Orientation-Oriented X3. Build & Nutrition-Well nourished and Well developed.  Integumentary General Characteristics Surgical Scars - no surgical scar evidence of previous lumbar surgery. Lumbar Spine-Skin examination of the lumbar spine is without deformity, skin lesions, lacerations or abrasions.  Chest and Lung Exam Auscultation Breath sounds - Normal and Clear.  Cardiovascular Auscultation Rhythm - Regular rate and rhythm.  Abdomen Palpation/Percussion Palpation and Percussion of the abdomen reveal - Soft, Non Tender and No Rebound tenderness.  Peripheral Vascular Lower Extremity Palpation - Posterior tibial pulse - Bilateral - 2+. Dorsalis pedis pulse - Bilateral - 2+.  Neurologic Sensation Lower Extremity - Bilateral - sensation is intact in the lower extremity. Reflexes Patellar Reflex - Bilateral - 2+. Achilles Reflex - Bilateral - 2+. Clonus - Bilateral - clonus not present. Hoffman's Sign - Bilateral - Hoffman's sign not present. Testing Seated Straight Leg Raise - Bilateral - Seated straight leg raise negative.  Musculoskeletal Spine/Ribs/Pelvis  Lumbosacral Spine: Inspection and Palpation - Tenderness - no soft tissue tenderness to palpation and no bony tenderness to palpation, bony and soft tissue palpation of the lumbar spine and SI joint does not recreate their typical pain. Strength and Tone: Strength - Hip Flexion - Bilateral - 5/5. Knee Extension - Bilateral - 5/5. Knee Flexion - Bilateral - 5/5. Ankle Dorsiflexion - Bilateral - 5/5. Ankle Plantarflexion - Bilateral - 5/5. Heel walk - Bilateral - able to heel walk without difficulty. Toe Walk - Bilateral - able to walk on toes without difficulty. Heel-Toe Walk - Bilateral - able to heel-toe  walk  without difficulty. ROM - Flexion - moderately decreased range of motion and painful. Extension - moderately decreased range of motion and painful. Left Lateral Bending - moderately decreased range of motion and painful. Right Lateral Bending - moderately decreased range of motion and painful. Right Rotation - moderately decreased range of motion and painful. Left Rotation - moderately decreased range of motion and painful. Pain - neither flexion or extension is more painful than the other. Lumbosacral Spine - Waddell's Signs - no Waddell's signs present. Lower Extremity Range of Motion - No true hip, knee or ankle pain with range of motion. Gait and Station - [employer] - no assistive devices.   IMAGING STUDIES Include plain x-rays from 05/12/2016 which do demonstrate collapse greater than 50% loss of disc space height at L5-S1. No scoliosis, no spondylolisthesis. His MRI from 05/21/2016 confirms the degenerative disc disease, previous laminotomy at L5-S1 and the collapse and posterior hard disc osteophyte formation at L5-S1. No significant adjacent degenerative collapse.  Assessment & Plan  Goal Of Surgery: Discussed that goal of surgery is to reduce pain and improve function and quality of life. Patient is aware that despite all appropriate treatment that there pain and function could be the same, worse, or different.  Anterior lumbar fusion: Risks of surgery include, but are not limited to: Death, stroke, paralysis, nerve root damage/injury, bleeding, blood clots, loss of bowel/bladder control, sexual dysfunction, retrograde ejaculation, hardware failure, or mal-position, spinal fluid leak, adjacent segment disease, non-union, need for further surgery, ongoing or worse pain, injury to bladder, bowel and abdominal contents, infection. Need for supplemental posterior surgery including decompression and need for screw fixation.  At this point in time, despite physical therapy, injection  therapy, activity modification, he continues to have debilitating back pain. 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 At this point, he is interested in moving forward with surgery and I think that is reasonable. His pain has been present for greater than six months. He has failed appropriate conservative management. The patient is a nonsmoker. We have gone over the risks of an anterior lumbar interbody fusion, which include infection, bleeding, nerve damage, death, stroke, paralysis, failure to heal, need for further surgery, ongoing or worse pain, loss of bowel or bladder control, retrograde ejaculation, need for additional surgery. All of his questions were addressed.

## 2017-02-07 NOTE — Op Note (Signed)
    Patient name: Ian Perez MRN: [id number] DOB: 1965/07/12 Sex: male  02/07/2017 Pre-operative Diagnosis: degenerative back disease Post-operative diagnosis:  Same Surgeon:  Annamarie Major Co-Surgeon:  D. Brooks Procedure:   Anterior exposure L5-S1 Anesthesia:  General Blood Loss:  See anesthesia record Specimens:  none  Findings:  Normal anatomy  Indications:  51 year old scheduled for back surgery via an anterior approach.  I am asked to provide exposure  Procedure:  The patient was identified in the holding area and taken to [place]  The patient was then placed supine on the table. general anesthesia was administered.  The patient was prepped and draped in the usual sterile fashion.  A time out was called and antibiotics were administered.  Flouroscopy was used to identify the location of the skin incision.  A transverse left lower quadrant incision was made beginning at the midline.  Cautery was used to divide the tissue down to the fascia.  The fascia was opened with cautery.  Sub-fascial flaps were raised.  I initially entered the retroperitoneum from lateral to the rectus muscle.  Once the plane was developed, I approached the spine from medial to the rectus.  I identified the iliac artery and vein an fully mobilized them.  The ureter was identified and mobilized laterally.I bluntly dissected out the edges of the spine with the use of Wiley retractors.  The Thompson retractor was set up.  150 reverse lip blades were placed on with side of the spine.  The median sacral vessels were divided with cautery.  I used malleable retractors on the superior and inferior aspects of the exposure.  Once adequate exposure was confirmed, Dr. Rolena Infante came in and continued the procedure, including closure of the wound.  There were no immediate complication   Disposition:  To PACU STABLE   V. Annamarie Major, M.D. [facility] Office: [phone] Pager:   [phone]

## 2017-02-07 NOTE — Brief Op Note (Signed)
02/07/2017  11:36 AM  PATIENT:  Ian Perez  51 y.o. male  PRE-OPERATIVE DIAGNOSIS:  Degenerative disc disease L5-S1  POST-OPERATIVE DIAGNOSIS:  Degenerative disc disease L5-S1  PROCEDURE:  Procedure(s) with comments: ANTERIOR LUMBAR FUSION L5-S1 (N/A) - 3 hrs ABDOMINAL EXPOSURE (N/A)  SURGEON:  Surgeon(s) and Role: Panel 1:    Melina Schools, MD - Primary  Panel 2:    * Serafina Mitchell, MD - Primary  PHYSICIAN ASSISTANT:   ASSISTANTS: Carmen Mayo   ANESTHESIA:   none  EBL:  Total I/O In: 2000 [I.V.:2000] Out: 275 [Urine:150; Blood:125]  BLOOD ADMINISTERED:none  DRAINS: none   LOCAL MEDICATIONS USED:  NONE  SPECIMEN:  No Specimen  DISPOSITION OF SPECIMEN:  N/A  COUNTS:  YES  TOURNIQUET:  * No tourniquets in log *  DICTATION: .Dragon Dictation  PLAN OF CARE: Admit to inpatient   PATIENT DISPOSITION:  PACU - hemodynamically stable.

## 2017-02-07 NOTE — Evaluation (Signed)
Physical Therapy Evaluation Patient Details Name: Ian Perez MRN: [id number] DOB: 1965/06/12 Today's Date: 02/07/2017   History of Present Illness  Pt is a 51 y/o male s/p L5-S1 ALIF. PMH includes anxiety, depression, former smoker, DM.   Clinical Impression  Patient is s/p above surgery resulting in the deficits listed below (see PT Problem List). PTA, pt was independent with functional mobility. Upon eval, pt presenting with post op pain and slight unsteadiness. Required supervision to min guard assist for mobility this session. Reports children can assist upon return home. Will need DME below. Patient will benefit from skilled PT to increase their independence and safety with mobility (while adhering to their precautions) to allow discharge to the venue listed below. Will continue to follow acutely to maximize functional mobility independence and safety.      Follow Up Recommendations No PT follow up;Supervision - Intermittent    Equipment Recommendations  3in1 (PT)    Recommendations for Other Services       Precautions / Restrictions Precautions Precautions: Back Precaution Booklet Issued: Yes (comment) Precaution Comments: Reviewed back precautions with pt.  Required Braces or Orthoses: Spinal Brace Spinal Brace: Lumbar corset;Applied in standing position Restrictions Weight Bearing Restrictions: No      Mobility  Bed Mobility Overal bed mobility: Needs Assistance Bed Mobility: Rolling;Sidelying to Sit;Sit to Sidelying Rolling: Supervision Sidelying to sit: Supervision     Sit to sidelying: Supervision General bed mobility comments: Supervision for safety. Pt able to perform log roll without cues.   Transfers Overall transfer level: Needs assistance Equipment used: None Transfers: Sit to/from Stand Sit to Stand: Supervision         General transfer comment: Supervision for safety. Increased effort secondary to pain   Ambulation/Gait Ambulation/Gait  assistance: Min guard;Supervision Ambulation Distance (Feet): 400 Feet Assistive device: None Gait Pattern/deviations: Step-through pattern;Decreased stride length;Drifts right/left Gait velocity: Decreased Gait velocity interpretation: Below normal speed for age/gender General Gait Details: Supervision to occaisional min guard for balance during gait. Pt had slight LOB to the R X 2 during ambulation and required min guard for steadying. Guarded gait secondary to pain.   Stairs            Wheelchair Mobility    Modified Rankin (Stroke Patients Only)       Balance Overall balance assessment: Needs assistance Sitting-balance support: No upper extremity supported;Feet supported Sitting balance-Leahy Scale: Good     Standing balance support: No upper extremity supported;During functional activity Standing balance-Leahy Scale: Fair Standing balance comment: Slight LOB noted during gait requiring min guard for steadying.                              Pertinent Vitals/Pain Pain Assessment: Faces Faces Pain Scale: Hurts even more Pain Location: abdomen  Pain Descriptors / Indicators: Aching;Operative site guarding Pain Intervention(s): Limited activity within patient's tolerance;Monitored during session;Repositioned    Home Living Family/patient expects to be discharged to:: Private residence Living Arrangements: Children Available Help at Discharge: Family;Available PRN/intermittently Type of Home: House Home Access: Stairs to enter   [employer] of Steps: 1 (threshold step ) Home Layout: Two level Home Equipment: Shower seat - built in      Prior Function Level of Independence: Independent               Hand Dominance        Extremity/Trunk Assessment   Upper Extremity Assessment Upper Extremity Assessment: Defer to  OT evaluation    Lower Extremity Assessment Lower Extremity Assessment: Overall WFL for tasks assessed     Cervical / Trunk Assessment Cervical / Trunk Assessment: Other exceptions Cervical / Trunk Exceptions: s/p ALIF   Communication   Communication: No difficulties  Cognition Arousal/Alertness: Awake/alert Behavior During Therapy: WFL for tasks assessed/performed Overall Cognitive Status: Within Functional Limits for tasks assessed                                        General Comments General comments (skin integrity, edema, etc.): Reviewed generalized walking program and how to maintain precautions while showering.     Exercises     Assessment/Plan    PT Assessment Patient needs continued PT services  PT Problem List Decreased balance;Decreased mobility;Decreased knowledge of precautions;Pain       PT Treatment Interventions Gait training;Stair training;Functional mobility training;Therapeutic activities;Therapeutic exercise;Balance training;Neuromuscular re-education;Patient/family education    PT Goals (Current goals can be found in the Care Plan section)  Acute Rehab PT Goals Patient Stated Goal: to go home  PT Goal Formulation: With patient Time For Goal Achievement: 02/14/17 Potential to Achieve Goals: Good    Frequency Min 5X/week   Barriers to discharge        Co-evaluation               AM-PAC PT "6 Clicks" Daily Activity  Outcome Measure Difficulty turning over in bed (including adjusting bedclothes, sheets and blankets)?: None Difficulty moving from lying on back to sitting on the side of the bed? : None Difficulty sitting down on and standing up from a chair with arms (e.g., wheelchair, bedside commode, etc,.)?: None Help needed moving to and from a bed to chair (including a wheelchair)?: A Little Help needed walking in hospital room?: A Little Help needed climbing 3-5 steps with a railing? : A Little 6 Click Score: 21    End of Session Equipment Utilized During Treatment: Back brace Activity Tolerance: Patient tolerated  treatment well Patient left: in bed;with call bell/phone within reach Nurse Communication: Mobility status PT Visit Diagnosis: Unsteadiness on feet (R26.81);Other abnormalities of gait and mobility (R26.89);Pain Pain - part of body:  (abdomen )    Time: 4034-7425 PT Time Calculation (min) (ACUTE ONLY): 27 min   Charges:   PT Evaluation $PT Eval Low Complexity: 1 Low PT Treatments $Gait Training: 8-22 mins   PT G Codes:   PT G-Codes **NOT FOR INPATIENT CLASS** Functional Assessment Tool Used: Clinical judgement;AM-PAC 6 Clicks Basic Mobility Functional Limitation: Mobility: Walking and moving around Mobility: Walking and Moving Around Current Status (Z5638): At least 20 percent but less than 40 percent impaired, limited or restricted Mobility: Walking and Moving Around Goal Status [phone]): At least 1 percent but less than 20 percent impaired, limited or restricted    Leighton Ruff, PT, DPT  Acute Rehabilitation Services  Pager: [phone] 02/07/2017, 6:04 PM

## 2017-02-07 NOTE — Op Note (Signed)
Operative note.  Preoperative diagnosis. Degenerative lumbar disc disease L5-S1.  Postoperative diagnosis. Same.  Operative procedure. Anterior lumbar interbody fusion L5-S1.  Complications. None.  Approach surgeon. Dr. Annamarie Major  First assistant [facility]  Instrumentation system used. The RSP peek interbody cage with the corresponding intraplate and 0 profile. 25 mm locking screws  3.  Cage:  35 large 8 lordosis.  Allograft: vivogen  Indications. This is a very pleasant 51 year old gentleman said progressive debilitating back buttock and bilateral leg pain. Attempts at conservative management fail to alleviate his symptoms. After discussing treatment options he elected to proceed with surgery. All appropriate risks benefits and alternatives were discussed with the patient and consent was obtained.  Operative note. Patient was brought the operating room placed supine the operating table. After successful induction of general anesthesia and endotracheal intubation teds SCDs and a Foley were inserted. Timeout was taken to confirm patient procedure and all other important data. Once this was done fluoroscopy was then used to confirm the incision site was marked out. Dr. Trula Slade and my PA [facility] then performed a standard anterior retroperitoneal approach to the lumbar spine. Please refer to his dictation for specifics on the approach. Once the [facility] retractor blades were set up in the L5-S1 level was exposed I then scrubbed into the case and Dr. Trula Slade scrubbed out.  Lateral fluoroscopy view was taken with a marker in the 51 disc space to confirm the appropriate level. Annulotomy was performed with a 10 blade scalpel and then using a combination pituitary rongeurs curettes and Kerrison rongeurs I removed all the disc material. I then placed interdiscal distraction plugs and then continues to work posteriorly. Using a small angled curet as well as a 2 and 3 mm Kerrison rongeur I  resected the posterior osteophyte from the vertebral bodies of L5 and S1 and released the posterior annulus. I then swept with my nerve hook underneath the vertebral body confirming an adequate annular release and decompression.  At this point using live fluoroscopy I was able to place my distractor and freely distracted the intervertebral space confirming that parallel distraction and an excellent indirect foraminal decompression. Once this was confirmed I then trialed with rasp intervertebral trial spacers and elected to use a size 14 large 8 lordosis. The cage was obtained and packed with the allograft and then malleted to the appropriate depth. 0 profile anterior lumbar plate was then secured over. Self drilling screws were then placed to into the vertebral body of L5 1 into the vertebral body of S1. All screws had excellent purchase. The locking plate was then secured down and torqued according manufacturer's standards. This point the wound was copiously irrigated with normal saline and made sure hemostasis using bipolar electrocautery and FloSeal. At this point final x-rays were taken which were satisfactory hardware and graft were in good position. Excellent placement of the cage and the distraction of the intervertebral space and my indirect foraminal decompression. At this point I then sequentially removed the Thompson blades and then irrigated again. The fascia of the rectus was then closed with a running #1 PDS suture. Superficial 2-0 Vicryl suture and 3-0 Monocryl for the skin. Strips and a dry dressing were then applied. Intraoperative digital spot lateral was taken to confirm that there were no retained surgical instruments.  At this point the patient was ultimately extubated transferred the PACU that incident. The end of the case all needle sponge counts were correct.

## 2017-02-08 ENCOUNTER — Encounter (HOSPITAL_COMMUNITY): Payer: Self-pay | Admitting: Orthopedic Surgery

## 2017-02-08 ENCOUNTER — Observation Stay (HOSPITAL_BASED_OUTPATIENT_CLINIC_OR_DEPARTMENT_OTHER): Payer: BLUE CROSS/BLUE SHIELD

## 2017-02-08 ENCOUNTER — Observation Stay (HOSPITAL_COMMUNITY): Payer: BLUE CROSS/BLUE SHIELD

## 2017-02-08 DIAGNOSIS — Z9889 Other specified postprocedural states: Secondary | ICD-10-CM | POA: Diagnosis not present

## 2017-02-08 DIAGNOSIS — M5137 Other intervertebral disc degeneration, lumbosacral region: Secondary | ICD-10-CM | POA: Diagnosis not present

## 2017-02-08 LAB — GLUCOSE, CAPILLARY
GLUCOSE-CAPILLARY: 168 mg/dL — AB (ref 65–99)
GLUCOSE-CAPILLARY: 243 mg/dL — AB (ref 65–99)

## 2017-02-08 MED ORDER — ENOXAPARIN (LOVENOX) PATIENT EDUCATION KIT
PACK | Freq: Once | Status: DC
  Filled 2017-02-08: qty 1

## 2017-02-08 NOTE — Progress Notes (Signed)
    Subjective  - POD #1  Incision area is sore   Physical Exam:  Palpable pedal pulses Incision c/d/i Abdomen soft       Assessment/Plan:    Stable s/p L5-S1 Venous duplex later today Please call with any concerns  Ian Perez, [name] 02/08/2017 8:15 AM --  Vitals:   02/08/17 0400 02/08/17 0743  BP: 121/75 139/75  Pulse: (!) 102 94  Resp: 20 18  Temp: 98.5 F (36.9 C) 98.3 F (36.8 C)  SpO2: 98% 98%    Intake/Output Summary (Last 24 hours) at 02/08/17 0815 Last data filed at 02/08/17 0300  Gross per 24 hour  Intake          4381.17 ml  Output              550 ml  Net          3831.17 ml     Laboratory CBC    Component Value Date/Time   WBC 6.5 02/01/2017 1320   HGB 14.8 02/01/2017 1320   HCT 44.5 02/01/2017 1320   PLT 227 02/01/2017 1320    BMET    Component Value Date/Time   NA 136 02/01/2017 1320   K 4.3 02/01/2017 1320   CL 104 02/01/2017 1320   CO2 24 02/01/2017 1320   GLUCOSE 157 (H) 02/01/2017 1320   BUN 13 02/01/2017 1320   CREATININE 1.29 (H) 02/01/2017 1320   CALCIUM 9.6 02/01/2017 1320   GFRNONAA >60 02/01/2017 1320   GFRAA >60 02/01/2017 1320    COAG No results found for: INR, PROTIME No results found for: PTT  Antibiotics Anti-infectives    Start     Dose/Rate Route Frequency Ordered Stop   02/07/17 1600  ceFAZolin (ANCEF) IVPB 2g/100 mL premix     2 g 200 mL/hr over 30 Minutes Intravenous Every 8 hours 02/07/17 1451 02/08/17 0052   02/07/17 0649  ceFAZolin (ANCEF) IVPB 2g/100 mL premix     2 g 200 mL/hr over 30 Minutes Intravenous 30 min pre-op 02/07/17 0649 02/07/17 0912       V. Leia Alf, M.D. [facility] Office: [phone] Pager:  [phone]

## 2017-02-08 NOTE — Evaluation (Signed)
Occupational Therapy Evaluation and Discharge Patient Details Name: Ian Perez MRN: [id number] DOB: [birth year] Today's Date: 02/08/2017    History of Present Illness Pt is a 51 y/o male s/p L5-S1 ALIF. PMH includes anxiety, depression, former smoker, DM.    Clinical Impression   Pt reports she was independent with ADL PTA. Currently pt requires supervision for ADL and functional mobility. All back, safety, and ADL education completed with pt. Pt planning to d/c home with supervision from family. No further acute OT needs identified; signing off at this time. Please re-consult if needs change. Thank you for this referral.    Follow Up Recommendations  No OT follow up;Supervision - Intermittent    Equipment Recommendations  3 in 1 bedside commode    Recommendations for Other Services       Precautions / Restrictions Precautions Precautions: Back Precaution Booklet Issued: No Precaution Comments: Pt able to recall 3/3 back precautions and maintain throughout session Required Braces or Orthoses: Spinal Brace Spinal Brace: Lumbar corset Restrictions Weight Bearing Restrictions: No      Mobility Bed Mobility Overal bed mobility: Needs Assistance Bed Mobility: Rolling;Sidelying to Sit;Sit to Sidelying Rolling: Supervision Sidelying to sit: Supervision     Sit to sidelying: Supervision General bed mobility comments: for safety. good log roll technique. HOB flat with use of bed rails  Transfers Overall transfer level: Needs assistance Equipment used: None Transfers: Sit to/from Stand Sit to Stand: Supervision         General transfer comment: for safety. good technique and hand placement    Balance Overall balance assessment: Needs assistance Sitting-balance support: Feet supported;No upper extremity supported Sitting balance-Leahy Scale: Good     Standing balance support: No upper extremity supported;During functional activity Standing balance-Leahy Scale:  Fair                             ADL either performed or assessed with clinical judgement   ADL Overall ADL's : Needs assistance/impaired Eating/Feeding: Set up;Sitting   Grooming: Set up;Supervision/safety;Standing Grooming Details (indicate cue type and reason): Educated on use of 2 cups for oral care Upper Body Bathing: Set up;Sitting   Lower Body Bathing: Set up;Supervison/ safety;Sit to/from stand   Upper Body Dressing : Set up;Sitting Upper Body Dressing Details (indicate cue type and reason): to don/doff gown and brace Lower Body Dressing: Set up;Supervision/safety;Sit to/from stand Lower Body Dressing Details (indicate cue type and reason): Educated on compensatory strategies for LB ADL; pt able to cross foot over opposite knee to don socks Toilet Transfer: Supervision/safety;Ambulation Toilet Transfer Details (indicate cue type and reason): Educated on use of 3 in 1 over toilet   Toileting - Clothing Manipulation Details (indicate cue type and reason): Educated on proper technique for peri care without twisting and use of wet wipes Tub/ Shower Transfer: Supervision/safety;Walk-in shower;Ambulation;Shower Scientist, research (medical) Details (indicate cue type and reason): Educated on use of built in bench for safety initially Functional mobility during ADLs: Supervision/safety General ADL Comments: Educated pt on maintaining back precautions during functional activities, keeping frequently used items at [employer] top height, frequent mobility thorughout the day upon return home, log roll technique for bed mobility.     Vision         Perception     Praxis      Pertinent Vitals/Pain Pain Assessment: Faces Faces Pain Scale: Hurts even more Pain Location: abdomen Pain Descriptors / Indicators: Discomfort;Sore Pain Intervention(s): Monitored during session;Premedicated  before session     Hand Dominance     Extremity/Trunk Assessment Upper Extremity  Assessment Upper Extremity Assessment: Overall WFL for tasks assessed   Lower Extremity Assessment Lower Extremity Assessment: Defer to PT evaluation   Cervical / Trunk Assessment Cervical / Trunk Assessment: Other exceptions Cervical / Trunk Exceptions: s/p ALIF    Communication Communication Communication: No difficulties   Cognition Arousal/Alertness: Awake/alert Behavior During Therapy: WFL for tasks assessed/performed Overall Cognitive Status: Within Functional Limits for tasks assessed                                     General Comments       Exercises     Shoulder Instructions      Home Living Family/patient expects to be discharged to:: Private residence Living Arrangements: Children Available Help at Discharge: Family;Available PRN/intermittently Type of Home: House Home Access: Stairs to enter Entrance Stairs-Number of Steps: 1   Home Layout: Two level;Bed/bath upstairs Alternate Level Stairs-Number of Steps: flight  Alternate Level Stairs-Rails: Left Bathroom Shower/Tub: Occupational psychologist: Standard     Home Equipment: Shower seat - built in          Prior Functioning/Environment Level of Independence: Independent        Comments: worked as a Runner, broadcasting/film/video; 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 due to back pain        OT Problem List:        OT Treatment/Interventions:      OT Goals(Current goals can be found in the care plan section) Acute Rehab OT Goals Patient Stated Goal: to go home  OT Goal Formulation: All assessment and education complete, DC therapy  OT Frequency:     Barriers to D/C:            Co-evaluation              AM-PAC PT "6 Clicks" Daily Activity     Outcome Measure Help from another person eating meals?: None Help from another person taking care of personal grooming?: A Little Help from another person toileting, which includes using toliet, bedpan, or urinal?: A Little Help from  another person bathing (including washing, rinsing, drying)?: A Little Help from another person to put on and taking off regular upper body clothing?: None Help from another person to put on and taking off regular lower body clothing?: A Little 6 Click Score: 20   End of Session Equipment Utilized During Treatment: Back brace Nurse Communication: Mobility status;Other (comment) (equipment and f/u needs)  Activity Tolerance: Patient tolerated treatment well Patient left: in bed;with call bell/phone within reach  OT Visit Diagnosis: Unsteadiness on feet (R26.81)                Time: 6283-1517 OT Time Calculation (min): 19 min Charges:  OT General Charges $OT Visit: 1 Visit OT Evaluation $OT Eval Moderate Complexity: 1 Mod G-Codes: OT G-codes **NOT FOR INPATIENT CLASS** Functional Assessment Tool Used: Clinical judgement Functional Limitation: Self care Self Care Current Status (O1607): At least 1 percent but less than 20 percent impaired, limited or restricted Self Care Goal Status (P7106): At least 1 percent but less than 20 percent impaired, limited or restricted Self Care Discharge Status [phone]): At least 1 percent but less than 20 percent impaired, limited or restricted   Mel Almond A. Ulice Brilliant, M.S., OTR/L Pager: [phone]  Binnie Kand  02/08/2017, 8:28 AM

## 2017-02-08 NOTE — Progress Notes (Signed)
Pt received 3-n-1 from [facility] prior to D/C. Landan Fedie, RN  

## 2017-02-08 NOTE — Progress Notes (Signed)
Physical Therapy Treatment Patient Details Name: Ian Perez MRN: [id number] DOB: Oct 01, 1965 Today's Date: 02/08/2017    History of Present Illness Pt is a 51 y/o male s/p L5-S1 ALIF. PMH includes anxiety, depression, former smoker, DM.     PT Comments    Pt progressing towards physical therapy goals. Was able to perform transfers and ambulation with gross supervision for safety. Pt was able to complete stair training this session and was educated on precautions, car transfer, walking program, and brace application/wearing schedule. Will continue to follow.   Follow Up Recommendations  No PT follow up;Supervision - Intermittent     Equipment Recommendations  3in1 (PT)    Recommendations for Other Services       Precautions / Restrictions Precautions Precautions: Back Precaution Booklet Issued: No Precaution Comments: Pt able to recall 3/3 back precautions and maintain throughout session Required Braces or Orthoses: Spinal Brace Spinal Brace: Lumbar corset Restrictions Weight Bearing Restrictions: No    Mobility  Bed Mobility Overal bed mobility: Needs Assistance Bed Mobility: Rolling;Sidelying to Sit;Sit to Sidelying Rolling: Modified independent (Device/Increase time) Sidelying to sit: Supervision     Sit to sidelying: Supervision General bed mobility comments: for safety. good log roll technique. HOB flat with rails lowered to simulate home environment  Transfers Overall transfer level: Needs assistance Equipment used: None Transfers: Sit to/from Stand Sit to Stand: Supervision         General transfer comment: Pt demonstrated good hand placement on seated surface for safety.   Ambulation/Gait Ambulation/Gait assistance: Supervision Ambulation Distance (Feet): 400 Feet Assistive device: None Gait Pattern/deviations: Step-through pattern;Decreased stride length;Drifts right/left Gait velocity: Decreased Gait velocity interpretation: Below normal speed  for age/gender General Gait Details: Gross supervision for safety. Pt guarded at times but no unsteadiness or LOB noted.    Stairs Stairs: Yes   Stair Management: One rail Left;Step to pattern;Forwards Number of Stairs: 10 General stair comments: VC's for sequencing and general safety. Pt reports fear of falling when descending stairs.   Wheelchair Mobility    Modified Rankin (Stroke Patients Only)       Balance Overall balance assessment: Needs assistance Sitting-balance support: Feet supported;No upper extremity supported Sitting balance-Leahy Scale: Good     Standing balance support: No upper extremity supported;During functional activity Standing balance-Leahy Scale: Fair                              Cognition Arousal/Alertness: Awake/alert Behavior During Therapy: WFL for tasks assessed/performed Overall Cognitive Status: Within Functional Limits for tasks assessed                                        Exercises      General Comments        Pertinent Vitals/Pain Pain Assessment: Faces Faces Pain Scale: Hurts even more Pain Location: abdomen Pain Descriptors / Indicators: Discomfort;Sore Pain Intervention(s): Limited activity within patient's tolerance;Monitored during session;Repositioned    Home Living                      Prior Function            PT Goals (current goals can now be found in the care plan section) Acute Rehab PT Goals Patient Stated Goal: to go home  PT Goal Formulation: With patient Time For Goal Achievement: 02/14/17 Potential  to Achieve Goals: Good Progress towards PT goals: Progressing toward goals    Frequency    Min 5X/week      PT Plan Current plan remains appropriate    Co-evaluation              AM-PAC PT "6 Clicks" Daily Activity  Outcome Measure  Difficulty turning over in bed (including adjusting bedclothes, sheets and blankets)?: None Difficulty moving from  lying on back to sitting on the side of the bed? : None Difficulty sitting down on and standing up from a chair with arms (e.g., wheelchair, bedside commode, etc,.)?: None Help needed moving to and from a bed to chair (including a wheelchair)?: A Little Help needed walking in hospital room?: A Little Help needed climbing 3-5 steps with a railing? : A Little 6 Click Score: 21    End of Session Equipment Utilized During Treatment: Back brace Activity Tolerance: Patient tolerated treatment well Patient left: in bed;with call bell/phone within reach Nurse Communication: Mobility status PT Visit Diagnosis: Unsteadiness on feet (R26.81);Other abnormalities of gait and mobility (R26.89);Pain Pain - part of body:  (abdomen )     Time: 9728-2060 PT Time Calculation (min) (ACUTE ONLY): 17 min  Charges:  $Gait Training: 8-22 mins                    G Codes:       Rolinda Roan, PT, DPT Acute Rehabilitation Services Pager: [phone]    Thelma Comp 02/08/2017, 12:52 PM

## 2017-02-08 NOTE — Progress Notes (Signed)
Pt and family given D/C instructions with Rx's, verbal understanding was provided. Lovenox teaching was provided. Pt's incision is clean and dry with no sign of infection. Pt's IV was removed prior to D/C. Pt D/C'd home via wheelchair @ 1520 per MD order. Pt is stable @ D/C and has no other needs this at this time. Holli Humbles, RN

## 2017-02-08 NOTE — Progress Notes (Signed)
    Subjective: Procedure(s) (LRB): ANTERIOR LUMBAR FUSION L5-S1 (N/A) ABDOMINAL EXPOSURE (N/A) 1 Day Post-Op  Patient reports pain as 2 on 0-10 scale.  Reports decreased leg pain reports incisional back pain   Positive void Negative bowel movement Positive flatus Negative chest pain or shortness of breath  Objective: Vital signs in last 24 hours: Temp:  [97.7 F (36.5 C)-98.7 F (37.1 C)] 97.8 F (36.6 C) (09/20 1232) Pulse Rate:  [70-102] 70 (09/20 1232) Resp:  [7-20] 20 (09/20 1232) BP: (103-139)/(63-81) 129/79 (09/20 1232) SpO2:  [94 %-100 %] 98 % (09/20 1232)  Intake/Output from previous day: 09/19 0701 - 09/20 0700 In: 4381.2 [P.O.:400; I.V.:3881.2; IV Piggyback:100] Out: 550 [Urine:425; Blood:125]  Labs: No results for input(s): WBC, RBC, HCT, PLT in the last 72 hours. No results for input(s): NA, K, CL, CO2, BUN, CREATININE, GLUCOSE, CALCIUM in the last 72 hours. No results for input(s): LABPT, INR in the last 72 hours.  Physical Exam: Neurologically intact ABD soft Intact pulses distally Incision: dressing C/D/I Compartment soft  Assessment/Plan: Patient stable  xrays satisfactory position Doppler - negative Continue mobilization with physical therapy Continue care  Advance diet Up with therapy  Patient doing well - ok for d/c to home   Melina Schools, [place] [phone]

## 2017-02-08 NOTE — Progress Notes (Signed)
*  PRELIMINARY RESULTS* Vascular Ultrasound Bilateral lower extremity venous duplex has been completed.  Preliminary findings: No evidence of deep vein thrombosis or baker's cysts bilaterally.   [name] 02/08/2017, 9:10 AM

## 2017-02-13 ENCOUNTER — Encounter (HOSPITAL_COMMUNITY): Payer: Self-pay | Admitting: Orthopedic Surgery

## 2017-02-28 NOTE — Discharge Summary (Signed)
Patient ID: Ian Perez MRN: [id number] DOB/AGE: 28-Jan-1966 51 y.o.  Admit date: 02/07/2017 Discharge date: 02/08/17  Admission Diagnoses:  Lumbar DDD  Discharge Diagnoses:  Lumbar DDD  status post Procedure(s): ANTERIOR LUMBAR FUSION L5-S1 ABDOMINAL EXPOSURE  Past Medical History:  Diagnosis Date  . Anxiety   . Bipolar disorder ([place])   . 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 DDD (degenerative disc disease), lumbosacral   . Depression   . Diabetes mellitus without complication ([place])   . GERD (gastroesophageal reflux disease)   . Headache    migraines q 6-8 wks.  . History of kidney stones    passed spontaneously  . Sacroiliac joint pain     Surgeries: Procedure(s): ANTERIOR LUMBAR FUSION L5-S1 ABDOMINAL EXPOSURE on 02/07/2017   Consultants: Treatment Team:  Serafina Mitchell, MD  Discharged Condition: Improved  Hospital Course: Ian Perez is an 51 y.o. male who was admitted 02/07/2017 for operative treatment of Lumbar DDD. Patient failed conservative treatments (please see the history and physical for the specifics) and had severe unremitting pain that affects sleep, daily activities and work/hobbies. After pre-op clearance, the patient was taken to the operating room on 02/07/2017 and underwent  Procedure(s): ANTERIOR LUMBAR FUSION L5-S1 ABDOMINAL EXPOSURE.  Post op day one pt reports mild pain controlled by oral meds.  Pt reports decreased leg pain.  Pt is urinating w/o difficulty.  Pt is ambulating in hall way.  Pt is cleared by PT for DC.   Patient was given perioperative antibiotics:  Anti-infectives    Start     Dose/Rate Route Frequency Ordered Stop   02/07/17 1600  ceFAZolin (ANCEF) IVPB 2g/100 mL premix     2 g 200 mL/hr over 30 Minutes Intravenous Every 8 hours 02/07/17 1451 02/08/17 0052   02/07/17 0649  ceFAZolin (ANCEF) IVPB 2g/100 mL premix     2 g 200 mL/hr over 30 Minutes Intravenous 30 min pre-op 02/07/17 5643 02/07/17 0912        Patient was given sequential compression devices and early ambulation to prevent DVT.   Patient benefited maximally from hospital stay and there were no complications. At the time of discharge, the patient was urinating/moving their bowels without difficulty, tolerating a regular diet, pain is controlled with oral pain medications and they have been cleared by PT/OT.   Recent vital signs: No data found.    Recent laboratory studies: No results for input(s): WBC, HGB, HCT, PLT, NA, K, CL, CO2, BUN, CREATININE, GLUCOSE, INR, CALCIUM in the last 72 hours.  Invalid input(s): PT, 2   Discharge Medications:   Allergies as of 02/08/2017   No Known Allergies     Medication List    TAKE these medications   atorvastatin 10 MG tablet Commonly known as:  LIPITOR Take 10 mg by mouth daily.   carbamazepine 300 MG 12 hr capsule Commonly known as:  CARBATROL Take 600 mg by mouth every evening.   enoxaparin 40 MG/0.4ML injection Commonly known as:  LOVENOX Inject 0.4 mLs (40 mg total) into the skin daily.   famotidine 20 MG tablet Commonly known as:  PEPCID Take 20 mg by mouth 2 (two) times daily as needed for heartburn or indigestion.   lamoTRIgine 200 MG tablet Commonly known as:  LAMICTAL Take 200 mg by mouth 2 (two) times daily.   lisdexamfetamine 40 MG capsule Commonly known as:  VYVANSE Take 40 mg by mouth every morning.   LORazepam 0.5 MG tablet  Commonly known as:  ATIVAN Take 0.5 mg by mouth 2 (two) times daily.   metFORMIN 1000 MG tablet Commonly known as:  GLUCOPHAGE Take 1,000 mg by mouth 2 (two) times daily with a meal.   methocarbamol 500 MG tablet Commonly known as:  ROBAXIN Take 1 tablet (500 mg total) by mouth 3 (three) times daily.   mirtazapine 30 MG tablet Commonly known as:  REMERON Take 30 mg by mouth daily as needed (SLEEP).   ondansetron 4 MG disintegrating tablet Commonly known as:  ZOFRAN ODT Take 1 tablet (4 mg total) by mouth every 8 (eight)  hours as needed for nausea or vomiting.   SUMAtriptan 100 MG tablet Commonly known as:  IMITREX Take 1 tablet by mouth every 2 (two) hours as needed for migraine.   topiramate 100 MG tablet Commonly known as:  TOPAMAX Take 100 mg by mouth at bedtime.     ASK your doctor about these medications   oxyCODONE-acetaminophen 10-325 MG tablet Commonly known as:  PERCOCET Take 1 tablet by mouth every 4 (four) hours as needed for pain. Ask about: Should I take this medication?       Diagnostic Studies: Dg Lumbar Spine 2-3 Views  Result Date: 02/08/2017 CLINICAL DATA:  L5-S1 fusion. EXAM: LUMBAR SPINE - 2-3 VIEW COMPARISON:  02/07/2017 FINDINGS: Vertebral body alignment, heights and disc space heights are normal. There is mild spondylosis throughout the lumbar spine. Anterior interbody fusion hardware at the L5-S1 level intact and unchanged. Mild facet arthropathy over the mid to lower lumbar spine. IMPRESSION: L5-S1 fusion hardware intact and unchanged. Mild spondylosis throughout the lumbar spine. Electronically Signed   By: Marin Olp M.D.   On: 02/08/2017 08:50   Dg Lumbar Spine 2-3 Views  Result Date: 02/07/2017 CLINICAL DATA:  L5-S1 ALIF EXAM: DG C-ARM 61-120 MIN; LUMBAR SPINE - 2-3 VIEW COMPARISON:  None. FINDINGS: 2 spot fluoro images show the patient be status post a ORIF at L5-S1. No evidence for immediate complicating features. IMPRESSION: No evidence for immediate hardware complications status post anterior fusion. Electronically Signed   By: Misty Stanley M.D.   On: 02/07/2017 12:22   Dg C-arm 61-120 Min  Result Date: 02/07/2017 CLINICAL DATA:  L5-S1 ALIF EXAM: DG C-ARM 61-120 MIN; LUMBAR SPINE - 2-3 VIEW COMPARISON:  None. FINDINGS: 2 spot fluoro images show the patient be status post a ORIF at L5-S1. No evidence for immediate complicating features. IMPRESSION: No evidence for immediate hardware complications status post anterior fusion. Electronically Signed   By: Misty Stanley  M.D.   On: 02/07/2017 12:22   Dg Or Local Abdomen  Result Date: 02/07/2017 CLINICAL DATA:  L5-S1 ALIF. Final instrument count. Evaluate for foreign body. EXAM: OR ABDOMEN 1 VIEW COMPARISON:  None. FINDINGS: No unexpected opaque foreign body overlying the visualized abdomen and pelvis. L5-S1 ALIF with intact hardware. IMPRESSION: No unexpected foreign body overlying the visualized abdomen and pelvis. I telephoned these results directly to the operating room at the time of interpretation on 02/07/2017 at 11:41 a.m. Electronically Signed   By: Evangeline Dakin M.D.   On: 02/07/2017 11:44    Discharge Instructions    Incentive spirometry RT    Complete by:  As directed       Follow-up Information    Melina Schools, MD. Schedule an appointment as soon as possible for a visit in 2 week(s).   Specialty:  Orthopedic Surgery Contact information: [address] [place] [address] [phone]  Discharge Plan:  discharge to home.  Pt will f/u in clinic in 2 weeks.  Post op meds provided.   Disposition:     Signed: MayoDarla Lesches for Dr. Melina Schools [facility] [phone] 02/28/2017, 9:21 AM

## 2018-11-13 IMAGING — RF DG LUMBAR SPINE 2-3V
1 series · 2 of 2 positions shown · non-contrast
Comparison: None.

CLINICAL DATA: L5-S1 ALIF

EXAM:
DG C-ARM 61-120 MIN; LUMBAR SPINE - 2-3 VIEW

[Series 1: run · 2 of 2 slices shown]
[im 1/2]
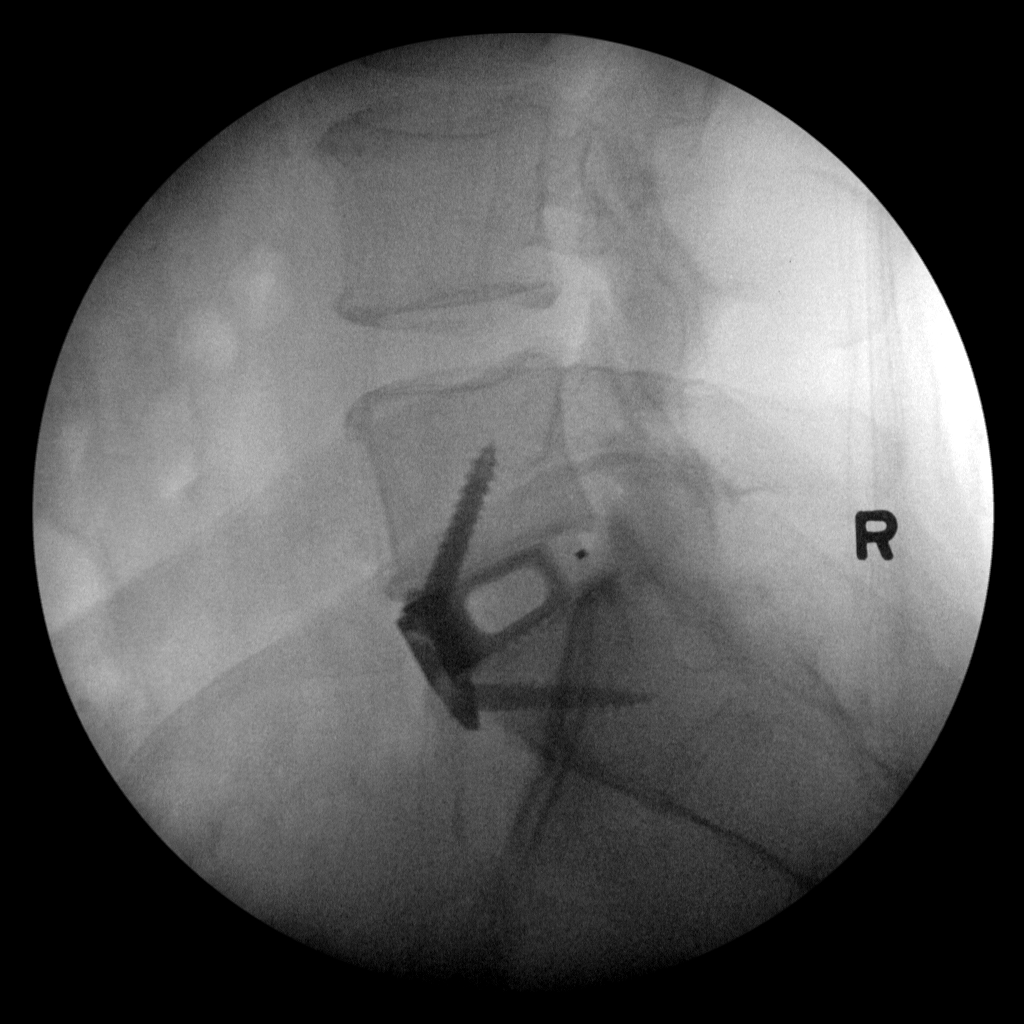
[im 2/2]
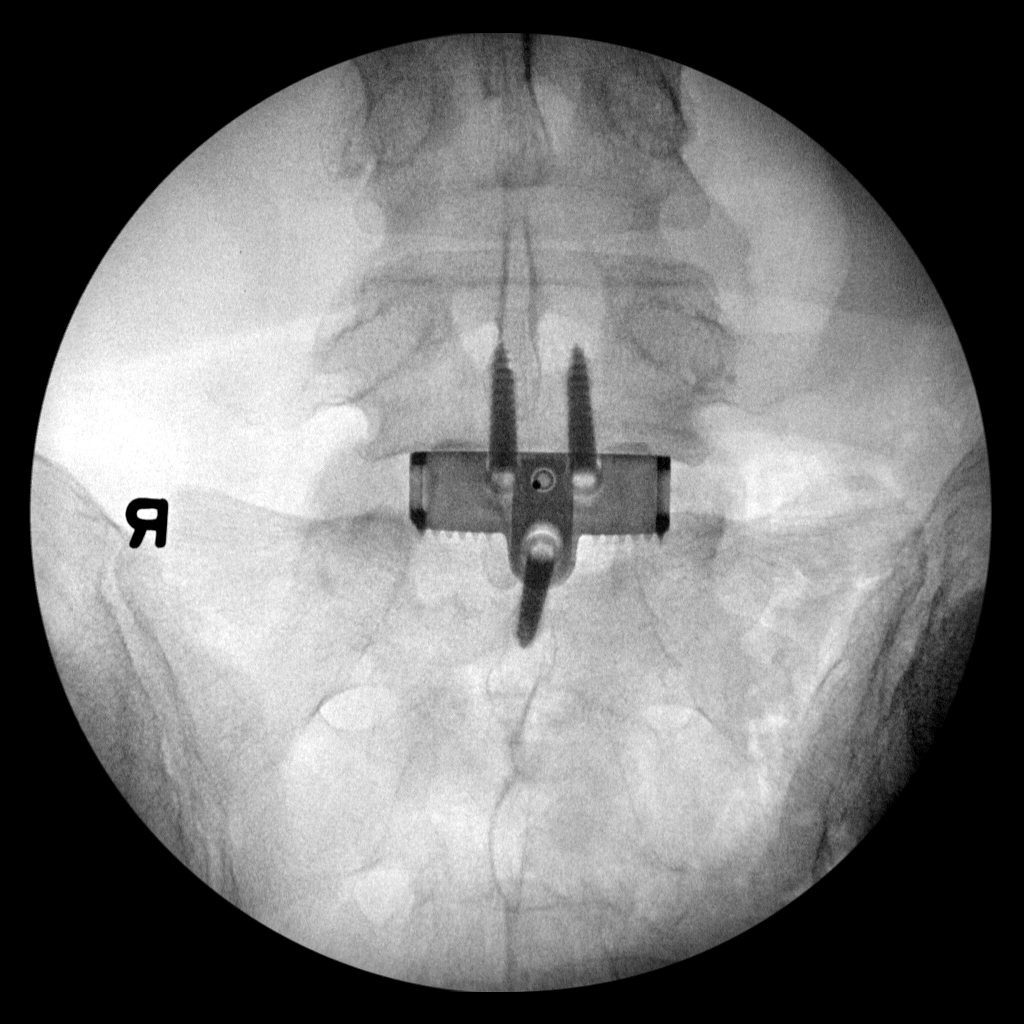

[2 of 2 positions shown; findings below may reference images not displayed]

FINDINGS: 2 spot fluoro images show the patient be status post a ORIF at
L5-S1. No evidence for immediate complicating features.
IMPRESSION: No evidence for immediate hardware complications status post
anterior fusion.
